# Patient Record
Sex: Female | Born: 1935 | Race: White | Hispanic: No | Marital: Married | Smoking: Never smoker
Health system: Southern US, Community
[De-identification: ages and names within clinical notes are randomized; demographics above are authoritative.]

## PROBLEM LIST (undated history)

## (undated) DIAGNOSIS — K219 Gastro-esophageal reflux disease without esophagitis: Secondary | ICD-10-CM

## (undated) DIAGNOSIS — M4800 Spinal stenosis, site unspecified: Secondary | ICD-10-CM

## (undated) DIAGNOSIS — R002 Palpitations: Secondary | ICD-10-CM

## (undated) DIAGNOSIS — N952 Postmenopausal atrophic vaginitis: Secondary | ICD-10-CM

## (undated) DIAGNOSIS — I10 Essential (primary) hypertension: Secondary | ICD-10-CM

## (undated) DIAGNOSIS — F419 Anxiety disorder, unspecified: Secondary | ICD-10-CM

## (undated) DIAGNOSIS — E785 Hyperlipidemia, unspecified: Secondary | ICD-10-CM

## (undated) DIAGNOSIS — I499 Cardiac arrhythmia, unspecified: Secondary | ICD-10-CM

## (undated) DIAGNOSIS — K118 Other diseases of salivary glands: Secondary | ICD-10-CM

## (undated) DIAGNOSIS — N898 Other specified noninflammatory disorders of vagina: Secondary | ICD-10-CM

## (undated) HISTORY — DX: Palpitations: R00.2

## (undated) HISTORY — DX: Spinal stenosis, site unspecified: M48.00

## (undated) HISTORY — DX: Other specified noninflammatory disorders of vagina: N89.8

## (undated) HISTORY — PX: CHOLECYSTECTOMY: SHX55

## (undated) HISTORY — DX: Anxiety disorder, unspecified: F41.9

## (undated) HISTORY — DX: Essential (primary) hypertension: I10

## (undated) HISTORY — DX: Gastro-esophageal reflux disease without esophagitis: K21.9

## (undated) HISTORY — DX: Hyperlipidemia, unspecified: E78.5

## (undated) HISTORY — PX: UPPER GASTROINTESTINAL ENDOSCOPY: SHX188

## (undated) HISTORY — DX: Postmenopausal atrophic vaginitis: N95.2

## (undated) HISTORY — PX: COLONOSCOPY: SHX174

---

## 1962-11-23 HISTORY — PX: OTHER SURGICAL HISTORY: SHX169

## 1970-11-23 HISTORY — PX: DILATION AND CURETTAGE OF UTERUS: SHX78

## 2000-12-28 ENCOUNTER — Encounter: Payer: Self-pay | Admitting: Neurosurgery

## 2000-12-28 ENCOUNTER — Encounter: Admission: RE | Admit: 2000-12-28 | Discharge: 2000-12-28 | Payer: Self-pay | Admitting: Neurosurgery

## 2001-03-07 ENCOUNTER — Encounter: Payer: Self-pay | Admitting: Internal Medicine

## 2001-03-07 ENCOUNTER — Ambulatory Visit (HOSPITAL_COMMUNITY): Admission: RE | Admit: 2001-03-07 | Discharge: 2001-03-07 | Payer: Self-pay | Admitting: Internal Medicine

## 2002-04-12 ENCOUNTER — Ambulatory Visit (HOSPITAL_COMMUNITY): Admission: RE | Admit: 2002-04-12 | Discharge: 2002-04-12 | Payer: Self-pay | Admitting: Internal Medicine

## 2002-04-12 ENCOUNTER — Encounter: Payer: Self-pay | Admitting: Internal Medicine

## 2002-09-06 ENCOUNTER — Encounter: Payer: Self-pay | Admitting: Internal Medicine

## 2002-09-06 ENCOUNTER — Ambulatory Visit (HOSPITAL_COMMUNITY): Admission: RE | Admit: 2002-09-06 | Discharge: 2002-09-06 | Payer: Self-pay | Admitting: Internal Medicine

## 2003-04-13 ENCOUNTER — Encounter: Payer: Self-pay | Admitting: Internal Medicine

## 2003-04-13 ENCOUNTER — Ambulatory Visit (HOSPITAL_COMMUNITY): Admission: RE | Admit: 2003-04-13 | Discharge: 2003-04-13 | Payer: Self-pay | Admitting: Internal Medicine

## 2004-04-28 ENCOUNTER — Ambulatory Visit (HOSPITAL_COMMUNITY): Admission: RE | Admit: 2004-04-28 | Discharge: 2004-04-28 | Payer: Self-pay | Admitting: Internal Medicine

## 2004-05-07 ENCOUNTER — Ambulatory Visit (HOSPITAL_COMMUNITY): Admission: RE | Admit: 2004-05-07 | Discharge: 2004-05-07 | Payer: Self-pay | Admitting: Internal Medicine

## 2004-11-03 ENCOUNTER — Ambulatory Visit: Payer: Self-pay | Admitting: Cardiology

## 2004-11-03 ENCOUNTER — Ambulatory Visit (HOSPITAL_COMMUNITY): Admission: RE | Admit: 2004-11-03 | Discharge: 2004-11-03 | Payer: Self-pay | Admitting: Internal Medicine

## 2005-04-27 ENCOUNTER — Ambulatory Visit (HOSPITAL_COMMUNITY): Admission: RE | Admit: 2005-04-27 | Discharge: 2005-04-28 | Payer: Self-pay | Admitting: Internal Medicine

## 2005-04-29 ENCOUNTER — Ambulatory Visit (HOSPITAL_COMMUNITY): Admission: RE | Admit: 2005-04-29 | Discharge: 2005-04-29 | Payer: Self-pay | Admitting: Internal Medicine

## 2005-04-29 ENCOUNTER — Ambulatory Visit: Payer: Self-pay | Admitting: Cardiology

## 2006-05-03 ENCOUNTER — Ambulatory Visit (HOSPITAL_COMMUNITY): Admission: RE | Admit: 2006-05-03 | Discharge: 2006-05-03 | Payer: Self-pay

## 2006-06-01 ENCOUNTER — Ambulatory Visit: Payer: Self-pay | Admitting: Internal Medicine

## 2006-06-03 ENCOUNTER — Ambulatory Visit: Payer: Self-pay | Admitting: Internal Medicine

## 2006-06-03 ENCOUNTER — Ambulatory Visit (HOSPITAL_COMMUNITY): Admission: RE | Admit: 2006-06-03 | Discharge: 2006-06-03 | Payer: Self-pay | Admitting: Internal Medicine

## 2006-06-04 ENCOUNTER — Ambulatory Visit (HOSPITAL_COMMUNITY): Admission: RE | Admit: 2006-06-04 | Discharge: 2006-06-04 | Payer: Self-pay | Admitting: Internal Medicine

## 2006-07-27 ENCOUNTER — Ambulatory Visit: Payer: Self-pay | Admitting: Internal Medicine

## 2006-08-02 ENCOUNTER — Encounter (HOSPITAL_COMMUNITY): Admission: RE | Admit: 2006-08-02 | Discharge: 2006-08-21 | Payer: Self-pay | Admitting: Internal Medicine

## 2006-10-05 ENCOUNTER — Ambulatory Visit (HOSPITAL_COMMUNITY): Admission: RE | Admit: 2006-10-05 | Discharge: 2006-10-05 | Payer: Self-pay | Admitting: Ophthalmology

## 2007-02-21 ENCOUNTER — Ambulatory Visit: Payer: Self-pay | Admitting: Internal Medicine

## 2007-03-10 ENCOUNTER — Encounter (HOSPITAL_COMMUNITY): Admission: RE | Admit: 2007-03-10 | Discharge: 2007-04-09 | Payer: Self-pay | Admitting: Internal Medicine

## 2007-04-13 ENCOUNTER — Ambulatory Visit: Payer: Self-pay | Admitting: Internal Medicine

## 2007-04-19 ENCOUNTER — Ambulatory Visit: Payer: Self-pay | Admitting: Internal Medicine

## 2007-05-06 ENCOUNTER — Ambulatory Visit (HOSPITAL_COMMUNITY): Admission: RE | Admit: 2007-05-06 | Discharge: 2007-05-06 | Payer: Self-pay | Admitting: Internal Medicine

## 2007-10-27 ENCOUNTER — Ambulatory Visit: Payer: Self-pay | Admitting: Internal Medicine

## 2007-11-07 ENCOUNTER — Ambulatory Visit (HOSPITAL_COMMUNITY): Admission: RE | Admit: 2007-11-07 | Discharge: 2007-11-07 | Payer: Self-pay | Admitting: Internal Medicine

## 2007-11-07 ENCOUNTER — Encounter: Payer: Self-pay | Admitting: Internal Medicine

## 2007-11-07 ENCOUNTER — Ambulatory Visit: Payer: Self-pay | Admitting: Internal Medicine

## 2007-12-27 ENCOUNTER — Ambulatory Visit: Payer: Self-pay | Admitting: Internal Medicine

## 2008-05-08 ENCOUNTER — Ambulatory Visit (HOSPITAL_COMMUNITY): Admission: RE | Admit: 2008-05-08 | Discharge: 2008-05-08 | Payer: Self-pay | Admitting: Internal Medicine

## 2008-06-22 DIAGNOSIS — Z8679 Personal history of other diseases of the circulatory system: Secondary | ICD-10-CM | POA: Insufficient documentation

## 2008-06-22 DIAGNOSIS — K649 Unspecified hemorrhoids: Secondary | ICD-10-CM | POA: Insufficient documentation

## 2008-06-22 DIAGNOSIS — K219 Gastro-esophageal reflux disease without esophagitis: Secondary | ICD-10-CM | POA: Insufficient documentation

## 2008-06-22 DIAGNOSIS — Z87448 Personal history of other diseases of urinary system: Secondary | ICD-10-CM | POA: Insufficient documentation

## 2008-06-22 DIAGNOSIS — R197 Diarrhea, unspecified: Secondary | ICD-10-CM | POA: Insufficient documentation

## 2008-06-22 DIAGNOSIS — I359 Nonrheumatic aortic valve disorder, unspecified: Secondary | ICD-10-CM | POA: Insufficient documentation

## 2008-11-23 HISTORY — PX: OTHER SURGICAL HISTORY: SHX169

## 2008-12-12 ENCOUNTER — Ambulatory Visit: Payer: Self-pay | Admitting: Internal Medicine

## 2008-12-12 LAB — CONVERTED CEMR LAB
IgA: 297 mg/dL (ref 68–378)
Tissue Transglutaminase Ab, IgA: 0.5 units (ref <7)

## 2008-12-13 ENCOUNTER — Encounter (HOSPITAL_COMMUNITY): Admission: RE | Admit: 2008-12-13 | Discharge: 2009-01-12 | Payer: Self-pay | Admitting: Internal Medicine

## 2009-01-11 ENCOUNTER — Ambulatory Visit (HOSPITAL_COMMUNITY): Admission: RE | Admit: 2009-01-11 | Discharge: 2009-01-11 | Payer: Self-pay | Admitting: General Surgery

## 2009-01-23 ENCOUNTER — Encounter: Payer: Self-pay | Admitting: Internal Medicine

## 2009-01-25 ENCOUNTER — Encounter: Admission: RE | Admit: 2009-01-25 | Discharge: 2009-01-25 | Payer: Self-pay | Admitting: General Surgery

## 2009-05-14 ENCOUNTER — Ambulatory Visit (HOSPITAL_COMMUNITY): Admission: RE | Admit: 2009-05-14 | Discharge: 2009-05-14 | Payer: Self-pay | Admitting: Internal Medicine

## 2009-07-23 ENCOUNTER — Ambulatory Visit: Payer: Self-pay | Admitting: Internal Medicine

## 2009-07-24 DIAGNOSIS — R1319 Other dysphagia: Secondary | ICD-10-CM | POA: Insufficient documentation

## 2009-07-25 ENCOUNTER — Telehealth (INDEPENDENT_AMBULATORY_CARE_PROVIDER_SITE_OTHER): Payer: Self-pay

## 2009-07-25 ENCOUNTER — Encounter: Payer: Self-pay | Admitting: Internal Medicine

## 2009-07-26 ENCOUNTER — Encounter (INDEPENDENT_AMBULATORY_CARE_PROVIDER_SITE_OTHER): Payer: Self-pay

## 2009-07-26 ENCOUNTER — Encounter: Payer: Self-pay | Admitting: Internal Medicine

## 2009-07-26 DIAGNOSIS — K828 Other specified diseases of gallbladder: Secondary | ICD-10-CM | POA: Insufficient documentation

## 2009-07-26 DIAGNOSIS — Z860100 Personal history of colon polyps, unspecified: Secondary | ICD-10-CM | POA: Insufficient documentation

## 2009-07-30 LAB — CONVERTED CEMR LAB
Alkaline Phosphatase: 58 units/L (ref 39–117)
Bilirubin, Direct: 0.1 mg/dL (ref 0.0–0.3)
Indirect Bilirubin: 0.3 mg/dL (ref 0.0–0.9)
Total Bilirubin: 0.4 mg/dL (ref 0.3–1.2)
Total Protein: 6.6 g/dL (ref 6.0–8.3)

## 2009-08-01 ENCOUNTER — Telehealth (INDEPENDENT_AMBULATORY_CARE_PROVIDER_SITE_OTHER): Payer: Self-pay

## 2009-08-02 ENCOUNTER — Ambulatory Visit: Payer: Self-pay | Admitting: Internal Medicine

## 2009-08-02 ENCOUNTER — Ambulatory Visit (HOSPITAL_COMMUNITY): Admission: RE | Admit: 2009-08-02 | Discharge: 2009-08-02 | Payer: Self-pay | Admitting: Internal Medicine

## 2010-05-16 ENCOUNTER — Ambulatory Visit (HOSPITAL_COMMUNITY)
Admission: RE | Admit: 2010-05-16 | Discharge: 2010-05-16 | Payer: Self-pay | Source: Home / Self Care | Admitting: Internal Medicine

## 2010-10-14 ENCOUNTER — Ambulatory Visit: Payer: Self-pay | Admitting: Internal Medicine

## 2010-10-21 DIAGNOSIS — K3189 Other diseases of stomach and duodenum: Secondary | ICD-10-CM | POA: Insufficient documentation

## 2010-10-21 DIAGNOSIS — R1013 Epigastric pain: Secondary | ICD-10-CM

## 2010-11-04 ENCOUNTER — Ambulatory Visit (HOSPITAL_COMMUNITY)
Admission: RE | Admit: 2010-11-04 | Discharge: 2010-11-04 | Payer: Self-pay | Source: Home / Self Care | Attending: Internal Medicine | Admitting: Internal Medicine

## 2010-12-23 NOTE — Assessment & Plan Note (Signed)
Summary: 63YR F/U W/RMR,GERD/LAW   Visit Type:  Follow-up Visit Primary Care Provider:  fagan  Chief Complaint:  1 year follow up- still having problems with reflux and time for her tcs.  History of Present Illness: 77 year old lady here for followup. She has had  progressive postprandia, bandlike upper abdominal discomfort; she describes as "burning".  Patient reports its really" getting her downl". She's had symptoms now for some years; we noted a diminution in gallbladder ejection fraction on serial HIDA's x2 previously; ultrasound gallbladder previously demonstrated no abnormalities. She saw Dr. Claud Kelp. He did not feel that she would benefit from cholecystectomy at that time. She had some small erosions on EGD previously prior; history of adenomatous tissue removed from her colon in 2008. Recommended  she have another colonoscopy now. She's not have any lower GI tract symptoms. She is has been multiple acid suppressing agents  and more recently Carafate prescribed Dr. Ouida Sills without any improvement in her GI tract symptoms. She does not have any dysphagia or odynophagia. She has not lost any weight; she denies fever or  chills.  Current Problems (verified): 1)  Colonic Polyps, Hx of  (ICD-V12.72) 2)  Biliary Dyskinesia  (ICD-575.8) 3)  Dysphagia  (ICD-787.29) 4)  Diarrhea  (ICD-787.91) 5)  Hemorrhoids  (ICD-455.6) 6)  Gerd  (ICD-530.81) 7)  Hypertension, Hx of  (ICD-V12.50) 8)  Pvcs  () 9)  Aortic Stenosis  (ICD-424.1) 10)  Hematuria, Hx of  (ICD-V13.09) 11)  Spinal Stenosis  () 12)  Hyperlipidemia  (ICD-V45.77)  Current Medications (verified): 1)  Lipitor 5mg  .... Once Daily 2)  Maxzide 25mg  .... Once Daily 3)  Toprol Xl 25mg  .... Once Daily 4)  Ativan 2mg  .... At Bedtime 5)  Tums .... As Needed 6)  Zantac .... As Needed 7)  Asa 81 Mg .... Take 1 Tablet By Mouth Once A Day 8)  Carafate 1 Gm Tabs (Sucralfate) .... Q Ac and Hs As Needed 9)  Zegerid Otc 20-1100 Mg Caps  (Omeprazole-Sodium Bicarbonate) .... 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  Alcohol Use - yes Patient gets regular exercise.  Risk Factors: Exercise: yes (07/28/2009)  Risk Factors: Smoking Status: never (July 28, 2009)  Vital Signs:  Patient profile:   77 year old female Height:      60 inches Weight:      141 pounds BMI:     27.64 Temp:     97.7 degrees F oral Pulse rate:   68 / minute BP sitting:   122 / 70  (left arm) Cuff size:   regular  Vitals Entered By: Hendricks Limes LPN (October 14, 2010 2:28 PM)  Physical Exam  General:  pleasant alert lady resting comfortably Eyes:  no scleral icterus Lungs:  clear to auscultation Heart:  regular rate rhythm without murmur gallop or Abdomen:  abdomen nondistended positive bowel sounds soft, nontender without appreciable mass or organomegaly Rectal:  deferred until the time of colonoscopy  Impression & Recommendations:  Impression: 77 year old lady with chronic insidiously worsening postprandial bandlike upper abdominal pain she describes as burning. She has serial diminution in gallbladder ejection fraction on HIDA x2 ;normal gallbladder on ultrasound previously. No significant findings in her stomach previously. Acid suppression and  Carafate are not helping. I continue to believe this lady has symptomatic  biliary dyskinesia to account for her symptoms. Ultimately, she'll she's need to see the surgeon to get her gallbladder out. However, she does need a followup colonoscopy given findings back in 2008. I told Ms. Mansouri we'll get a look at her stomach at the same time just to make sure she hasn't developed any new occult pathology. Depending on findings of this evaluation, we'll likely get her back to see Dr. Derrell Lolling in a to reassess and reconsider cholecystectomy.   I discussed approach of EGd and  colonoscopy with Ms. Bergerson; questions been answered.  We talked about the risks, benefits limitations and imponderables and alternatives . She is agreeable. Further recommendations in  the very near future.  Appended Document: Orders Update    Clinical Lists Changes  Problems: Added new problem of DYSPEPSIA (ICD-536.8) Added new problem of SPECIAL SCREENING FOR MALIGNANT NEOPLASMS COLON (ICD-V76.51) Orders: Added new Service order of Est. Patient Level IV (16109) - Signed

## 2010-12-23 NOTE — Letter (Signed)
Summary: EGD /TCS ORDER  EGD /TCS ORDER   Imported By: Ave Filter 10/14/2010 16:02:33  _____________________________________________________________________  External Attachment:    Type:   Image     Comment:   External Document

## 2011-03-30 ENCOUNTER — Encounter (INDEPENDENT_AMBULATORY_CARE_PROVIDER_SITE_OTHER): Payer: Self-pay | Admitting: General Surgery

## 2011-04-02 ENCOUNTER — Other Ambulatory Visit: Payer: Self-pay | Admitting: General Surgery

## 2011-04-02 ENCOUNTER — Ambulatory Visit
Admission: RE | Admit: 2011-04-02 | Discharge: 2011-04-02 | Disposition: A | Discharge status: Home / Self Care | Payer: MEDICARE | Source: Ambulatory Visit | Attending: General Surgery | Admitting: General Surgery

## 2011-04-02 DIAGNOSIS — Z01811 Encounter for preprocedural respiratory examination: Secondary | ICD-10-CM

## 2011-04-07 NOTE — Assessment & Plan Note (Signed)
[name], [name]               CHART#:  [id number]   DATE:  12/12/2008                       DOB:  November 24, 1930   PROBLEM LIST:  1. History of postprandial epigastric pain.  2. History of gastroesophageal reflux disease/erosive reflux      esophagitis.  3. Intermittent diarrhea.   Last seen on December 27, 2007.  Over the past year, the patient has done  well.  She has had problems with intermittent vague postprandial  epigastric discomfort.  Prior ultrasound demonstrated unremarkable  gallbladder prior to  hiatus previously demonstrated, gallbladder EF is  in the 32% range without any reproduction in her symptoms.  These  symptoms have not worsened, but they have been persistent.  Her typical  reflux symptoms she has had along the way have been well managed with  the various proton pump inhibitors, Nexium, Aciphex, most recently  Prevacid.  She has also been on Prilosec as well.  She was having  worsening difficulty with epigastric pain back in the fall, saw Dr.  Ouida Sills and she bumped her Aciphex up to b.i.d.  This produced headache  and worsening of diarrhea, headache improved.  She has only one bowel  movement daily.  Colonoscopy back on November 07, 2007, demonstrated  normal ileum, some left-sided transverse diverticula, some anal canal  hemorrhoids.  Segmental biopsies regular rule out microscopic colitis.  There was some adenomatoid mucosa, but there was microscopic colitis.  Originally it was called as an adenoma polyp in one of the areas of  random mucosal biopsies.  After discussion with Dr. Colonel Bald, pathologist,  it was felt this was probably a variant of normal.  However, it was  recommended in this case that she come back in 2011 for a repeat  colonoscopy.  Stool studies at that time also negative for  inflammation/infection.   CURRENT MEDICATIONS:  See updated list.   ALLERGIES:  No known drug allergies.   PHYSICAL EXAMINATION:  GENERAL:  Today well-groomed,  down-to-earth lady  resting comfortably.  VITAL SIGNS:  Her weight is down 2 pounds improved from previous weight,  height 5 feet, temperature 98, BP 130/80, and pulse 60.  SKIN:  Warm and dry.  CHEST:  Lungs are clear to auscultation.  CARDIAC:  Regular rate and rhythm without murmur, gallop, or rub.  ABDOMEN:  Nondistended, positive bowel sounds, soft, nontender without  appreciable mass or organomegaly.   ASSESSMENT:  1. History of gastroesophageal reflux disease/erosive reflux      esophagitis.  Symptoms are well controlled on proton pump inhibitor      therapy.  She has had side effects with Nexium, more recently      Aciphex tolerating, Prevacid 30 mg once daily very well.  I have      suggested she continue that regimen.  I have given her      prescription.  2. Epigastric pain often postprandial.  I would have to be concerned      that her gallbladder may have been the culprit along.  These      symptoms are not at all going away, they are intermittent in      nature.  She has had some vague off and on diarrhea and abdominal      bloating along the way and she has not been screened for celiac  disease, although this would be a reach, this patient needs to be      covered.  Again, I feel that her epigastric pain maybe secondary to      her gallbladder disease more than anything else.   RECOMMENDATIONS:  1. We will get a serum IgA and a transglutaminase IgA level.  2. We will go ahead and get a HIDA scan to see if we can document a      diminution in gallbladder ejection fraction.  This turns out to be      the case regardless of any postprocedure symptoms.  I told the      patient we would probably move towards getting her to see a surgeon      regarding cholecystectomy, but we will make further recommendations      in the very near future.       Jonathon Bellows, M.D.  Electronically Signed     RMR/MEDQ  D:  12/12/2008  T:  12/12/2008  Job:  [id number]   cc:   Kingsley Callander. Ouida Sills, MD

## 2011-04-07 NOTE — Assessment & Plan Note (Signed)
[name], [name]               CHART#:  [id number]   DATE:                                   DOB:  [birth year]   CHIEF COMPLAINT:  Follow up diarrhea.   HISTORY:  [name] was last seen by me on November 07, 2007, at  which time she underwent a colonoscopy, to further evaluate diarrhea.  She underwent a delayed colonoscopy with segmental biopsies, stool  sampling.  She does have some anal hemorrhoids and left-sided transverse  diverticula.  The remainder of the colonic mucosa, rectal mucosa and  terminal ileum mucosa appeared normal.  Segmental biopsies failed to  demonstrate microscopic colitis.  All of her stool studies from the  procedure came back negative; however, there was one Dr. Dierdre Searles did call  from her ascending colon, a tubular adenoma.  I went back and reviewed  with Dr. Nile Riggs.  He reviewed the pathology and felt like the  adenoma was actually adenomatoid epithelial mucosa not likely  clinically significant.   We have not found a culprit with her diarrhea, but the good news is that  today [name] reports her bowel function has normalized.  Her  stools have firmed up and frequency has diminished.  She is very happy.  Her reflux symptoms are well-controlled on Aciphex 20 grams once daily.  She states she is not quite back to normal as far as bowel function is  concerned.  She is almost there.  She has gained 1-1/2 pounds since her  last visit.   CURRENT MEDICATIONS:  See the updated list.   ALLERGIES:  No known drug allergies.   PHYSICAL EXAMINATION:  GENERAL:  She looks well.  VITAL SIGNS:  Weight 138-1/2 pounds, height 5 feet, temperature 97.7  degrees, blood pressure 118/72, pulse 72.  SKIN:  Warm and dry.  ABDOMEN:  Flat, with positive bowel sounds.  Entirely soft, nontender,  without appreciable mass or organomegaly.   ASSESSMENT:  Recent protracted diarrheal illness, seemingly now much  better.  We have not found the etiological edge.  It may  have been a  protracted viral syndrome with a little irritable bowel syndrome inter-  mixed.  Any right findings of evaluation are reassuring thus far.   RECOMMENDATIONS:  1. Will give her some samples of Align probiotic.  She can take one      capsule daily for one month or so, and see how she likes it.  2. Gastroesophageal reflux disease symptoms well-controlled on      Aciphex.  No further intervention warranted at Nicole time.   FOLLOWUP:  I will plan to see her back in one year and see how things  are going next year.  Given the interesting findings on the pathology  specimens which in no way do I feel is cause for alarm.  I will  plan to  bring [name] back for a repeat screening colonoscopy in three  years.       Jonathon Bellows, M.D.  Electronically Signed     RMR/MEDQ  D:  12/27/2007  T:  12/27/2007  Job:  [id number]   cc:   Kingsley Callander. Ouida Sills, MD

## 2011-04-07 NOTE — Op Note (Signed)
[name], [name]              ACCOUNT NO.:  [id number]   MEDICAL RECORD NO.:  [id number]          PATIENT TYPE:  AMB   LOCATION:  DAY                           FACILITY:  [facility]   PHYSICIAN:  R. Roetta Sessions, M.D. DATE OF BIRTH:  1931-10-23   DATE OF PROCEDURE:  11/07/2007  DATE OF DISCHARGE:                               OPERATIVE REPORT   PROCEDURE:  Ileal colonoscopy with single biopsy, stool sampling.   INDICATIONS FOR PROCEDURE:  A 77 year old lady with eight month history  of incessant diarrhea.  Colonoscopy is now being done to further  evaluate this symptom.  This approach has been discussed with the  patient at length.  Potential risks, benefits and alternatives have been  reviewed.  There is no history of inflammatory bowel disease or  colorectal cancer in the patient's family.  The last colonoscopy was  done years ago.   PROCEDURE NOTE:  O2 saturation, blood pressure, pulses, and respirations  were monitored throughout the entire procedure.  Conscious sedation with  Versed 4 mg IV and Demerol 75 mg IV in divided doses.   INSTRUMENT:  Pentax video chip system.   FINDINGS:  Digital rectal exam revealed minimal anal canal hemorrhoids.   ENDOSCOPIC FINDINGS:  Prep was adequate.   COLON:  The colonic mucosa was surveyed from the rectosigmoid junction  through the left transverse, right colon, the appendiceal orifice, the  ileocecal valve, and cecum.  These structures were well seen and  photographed for the record.  The terminal ileum had been intubated to 5  cm.  From this level, the scope was slowly withdrawn, and all previously  mentioned mucosal surfaces were again seen.  The patient had:  1)  Left-  sided transverse diverticula.  2)  Otherwise normal-appearing colonic  mucosa.  The terminal ileum mucosa also appeared normal.  Segmental  biopsies of the ascending, descending, and sigmoid segments were taken  for histologic study.  Also, stool samples sent to the  microbiology lab.  The scope was pulled down into the rectum, where a thorough examination  of the rectal mucosa was undertaken.  The rectal vault was small, and I  was unable to retroflex, although I tried, but for the same reason I was  able to see the rectal mucosa very well en face, and aside from minimal  anal canal hemorrhoids, the rectal mucosa appeared entirely normal.  The  rectal mucosa was also biopsied.  The patient tolerated the procedure  well and was reactive to endoscopy.   ENDOSCOPY IMPRESSION:  1. Anal canal hemorrhoids, otherwise normal rectum.  2. Left-sided transverse diverticula in the remaining colonic mucosa.  3. The terminal ileal mucosa appeared normal.  4. Status post segmental biopsy and stool sampling.   RECOMMENDATIONS:  1. Diverticulosis literature provided to Ms. Lelon Perla.  2. Follow up on path and stool studies.  3. Further recommendations to follow.   ADDENDUM:  As far as family history goes, I do know there is one cousin  who reportedly has Crohn's disease.      Jonathon Bellows, M.D.  Electronically Signed  RMR/MEDQ  D:  11/07/2007  T:  11/07/2007  Job:  [id number]   cc:   Kingsley Callander. Ouida Sills, MD  Fax: [phone]

## 2011-04-07 NOTE — Assessment & Plan Note (Signed)
[name], [name]               CHART#:  [id number]   DATE:  04/13/2007                       DOB:  January 23, 1931   For followup GERD, upper abdominal burning, biliary dyskinesia.  The  patient is back after office followup, 02/21/2007.  She had been on  Aciphex 20 mg orally b.i.d. with very good control of the above-  mentioned symptoms.  Ultrasound of the right upper quadrant was  negative.  Cholecystitis __________ sector.  Back in September, 2007,  gallbladder EF was 32%.  We repeated the HIDA scan recently which  demonstrated a gallbladder EF, again, of 32% without reproductive  symptoms.  She ran out of Aciphex and has gone with Prilosec 20 mg  orally b.i.d. and continues to be essentially symptom free.  She did go  to [facility] and developed acute diarrheal illness lasting 3 days  since her last office visit.  She tells me Dr. Lovell Sheehan performed a  colonoscopy in 2000, this was negative.  She is due for routine  screening in 2010.  Overall, she feels well right now and has no GI  symptoms.   CURRENT MEDICATIONS:  See updated list.   ALLERGIES:  No known drug allergies.   PHYSICAL EXAMINATION:  She looks well today.  Weight 142, height 5 feet, temp 97.6, blood pressure 124/70, pulse 66.  SKIN:  Warm and dry.  ABDOMEN:  Flat, obese, positive bowel sounds, soft nontender, without  appreciable mass or organomegaly.   ASSESSMENT:  Gastroesophageal reflux disease symptoms, now well  controlled on b.i.d. omeprazole.  She has biliary dyskinesia but  clinically quiescent.  I do not think she needs to get her gallbladder  out at this time, but certainly that could change sometime in the  future.   RECOMMENDATIONS:  Continue omeprazole/Prilosec 20 mg orally b.i.d. for  the next 3 months and drop back to once daily thereafter.  I plan to see  this nice lady back in 6 months.  We will take the liberty to go ahead  and have her return three mail-in hemoccult cards now.       Jonathon Bellows, M.D.  Electronically Signed     RMR/MEDQ  D:  04/13/2007  T:  04/13/2007  Job:  [id number]   cc:   Kingsley Callander. Ouida Sills, MD

## 2011-04-07 NOTE — H&P (Signed)
[name], [name]              ACCOUNT NO.:  [id number]   MEDICAL RECORD NO.:  [id number]          PATIENT TYPE:  AMB   LOCATION:  DAY                           FACILITY:  [facility]   PHYSICIAN:  R. Roetta Sessions, M.D. DATE OF BIRTH:  1931-04-06   DATE OF ADMISSION:  10/27/2007  DATE OF DISCHARGE:                              HISTORY & PHYSICAL   CHIEF COMPLAINT:  Diarrhea.   [name] was last seen Apr 13, 2007, for intermittent postprandial  epigastric pain, reflux symptoms.  She has had multiple HIDAs,  demonstrating a somewhat depressed gallbladder EF, but those symptoms  have settled and she is not known to have gallstones.  Her reflux  symptoms are now well-controlled on Aciphex 20 mg orally daily.  Her big  concern now is a good 6- to 35-month history of nonbloody watery  diarrhea.  She tells me that she is pretty well confined to home the  first half of the day because of multiple loose bowel movements.  She  has never passed any blood per rectum.  She rarely, rarely ever has a  formed bowel movement.  Most of her stools are in the morning, really  does not have a bowel movement later in the day.  This pattern has been  ongoing for 6 to 8 months although she has had more intermittent  diarrhea in the setting of normal bowel function over the past several  years.  She has lost 5 pounds since May 2008.  Her last colonoscopy was  in 2000, performed by Dr. _________.  Reportedly had  diverticulosis at  that time.  There is no family history of inflammatory bowel disease or  colorectal neoplasia.  She does take an Imodium occasionally for  diarrhea.   PAST MEDICAL HISTORY:  Significant for hyperlipidemia, spinal stenosis,  history of hematuria, aortic stenosis, PVCs, hypertension.   PAST SURGICAL HISTORY:  D&C, bilateral breast surgery for benign cysts.  EGD performed by me on June 03, 2006, demonstrated erosive reflux  esophagitis.   CURRENT MEDICATIONS:  1. Lipitor 5 mg  daily.  2. Maxzide 25 mg daily.  3. Toprol 25 mg daily.  4. Ativan 2 mg at bedtime.  5. Aciphex 20 mg once daily.   ALLERGIES:  No known drug allergies.   FAMILY HISTORY:  Negative for chronic GI or liver illness.  Mother is  still alive at 75 with dementia.  Father deceased at age 31 with heart  disease.  No history of chronic GI or liver illness although she does  remind me she had a first cousin with Crohn's disease.   SOCIAL HISTORY:  The patient is married, has one child.  She is a  retired Metallurgist.  No tobacco.  Rarely consumes alcohol  socially.   REVIEW OF SYSTEMS:  No chest pain, dyspnea on exertion.  No fevers or  chills.  Some weight loss as noted above.  She has not had any  odynophagia.  No dysphagia, early satiety, nausea or vomiting.  No  melena or hematochezia.   PHYSICAL EXAMINATION:  GENERAL:  Physical examination  today reveals a  pleasant 77 year old lady resting comfortably.  VITAL SIGNS:  Weight 137, height 5 foot, temperature 97, blood pressure  120/80, pulse 64.  SKIN:  Warm and dry.  There is no jaundice.  HEENT:  No scleral icterus.  Conjunctivae are pink.  CARDIAC:  Regular rate and rhythm without murmur, gallop, rub.  BREAST:  Exam deferred.  ABDOMEN:  Nondistended.  Positive bowel sounds.  Soft, nontender.  Without appreciable mass or organomegaly.  EXTREMITIES:  No edema.  RECTAL:  Exam deferred until the time of colonoscopy.   IMPRESSION:  [name] is a pleasant 77 year old lady with a good 8-  month history of basically incessant diarrhea which limits her ability  to function normally the first half of the day.  She really has a  history of intermittent diarrhea going back at least a few years  previous to this past 6 to 8 months.  Symptoms would be a little  atypical for a irritable bowel syndrome.  I would be concerned about the  possibility of microscopic colitis among other diagnostic possibilities.  It is now pushing 9  years since she last had her colon imaged.  I told  [name] we ought to go ahead and do an ileocolonoscopy and plan to  at a minimum perform some biopsies of her colonic mucosa to rule out  microscopic colitis and do one set of stool studies.  the potential  risks, benefits and alternatives have been reviewed with her.  Her  questions were answered and she is agreeable.  Recommendation is to  perform diagnostic colonoscopy in the very near future at [facility] and make further recommendations.  At this time, she has a  history of aortic stenosis; however, given the new guidelines, she will  not need SBE prophylactic antibiotics.      Jonathon Bellows, M.D.  Electronically Signed     RMR/MEDQ  D:  10/27/2007  T:  10/27/2007  Job:  [id number]   cc:   Kingsley Callander. Ouida Sills, MD  Fax: [phone]

## 2011-04-10 NOTE — Consult Note (Signed)
Nicole [name]              ACCOUNT NO.:  [id number]   MEDICAL RECORD NO.:  [id number]          PATIENT TYPE:  AMB   LOCATION:                                FACILITY:  [facility]   PHYSICIAN:  R. Roetta Sessions, M.D. DATE OF BIRTH:  November 21, 1931   DATE OF CONSULTATION:  06/01/2006  DATE OF DISCHARGE:                                   CONSULTATION   REASON FOR CONSULTATION:  Bloating, epigastric gnawing pain.   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 Carylon Perches for  further evaluation of above-mentioned symptoms.  Nicole [name] tells me for  the last year or so she has had intermittent retroxiphoid burning and  gnawing pain that sometimes radiates out to both upper quadrants.  Nicole  occurs after eating certain spicy, greasy foods.  Symptoms have been  ameliorated significantly with Nexium.  She has been on other acid-  suppression agents in the past including Protonix but Nexium seems to work  the best.  She has also had intermittent diarrhea which she thinks is also  improved with Nexium.  She may have 5 days of nonbloody diarrhea out of a  month and the rest of the time she has one to two formed bowel movements  daily.  She has not had any rectal bleeding or melena.  She had some severe  lower abdominal pain, approximately three episodes, back in 2003  attributable to diverticulitis.  She was not hospitalized.  Dr. Lovell Sheehan  reportedly performed a colonoscopy on Nicole [name] back in 2000 and  demonstrated only diverticulosis.   She tells me that milk certainly relieves her upper abdominal gnawing  discomfort.  Eating a meal generally also lessens the discomfort, but  depending on the content of the meal it may actually worsen the burning  sensation.   She does not have any frank regurgitation, no odynophagia or dysphagia.  She  does not use tobacco products but she has used a variety of  over-the-counter  nonsteroidals recently including Catering manager.  She has never had her upper  GI tract evaluated.  Back in 2003 she underwent an ultrasound of the abdomen  which demonstrated some thickening of the gallbladder wall but nothing else.  She saw Dr. Lovell Sheehan.  It was not felt that cholecystectomy was indicated at  that time.  She rarely consumes alcohol.  She does not use tobacco products.   PAST MEDICAL HISTORY:  Significant for hyperlipidemia, spinal stenosis,  history of hematuria, aortic stenosis, history of PVCs, hypertension.   PAST SURGERIES:  Include D&C, bilateral breast surgery for benign cysts.   CURRENT MEDICATIONS:  1.  Lipitor 5 mg daily.  2.  Nexium 40 mg daily.  3.  Maxzide 25 mg daily.  4.  Toprol-XL 25 mg daily.  5.  Ativan 2 mg at bedtime.  6.  Tums p.r.n.  7.  Mylanta p.r.n.  8.  Alka Seltzer p.r.n.   ALLERGIES:  No known drug allergies.   FAMILY HISTORY:  Mother is alive at age 45  with some dementia.  Father died  at age 52 with some heart disease.  No history of chronic GI or liver  illness although she reports her first cousin has Crohn's disease.   SOCIAL HISTORY:  The patient is married.  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  She has one child in good health.  No tobacco, occasional  alcohol.   REVIEW OF SYSTEMS:  No chest pain, dyspnea on exertion.  No change in  weight.  No fever or chills.   PHYSICAL EXAMINATION:  GENERAL:  Pleasant 77 year old [name] who looks  somewhat younger than her stated chronological age.  VITAL SIGNS:  Weight 141, height 5 feet.  Temperature 98.6, BP 160/82, pulse  70.  SKIN:  Warm and dry.  No jaundice, no cutaneous stigmata of chronic liver  disease.  HEENT:  No scleral icterus.  JVD is not prominent.  No oral cavity lesions.  CHEST:  Lungs are clear to auscultation.  CARDIAC:  Regular rate and rhythm without murmur, gallop, rub.  ABDOMEN:  Nondistended, positive bowel sounds.  She does have  some  epigastric tenderness to palpation without appreciable mass or organomegaly.  EXTREMITIES:  No edema.   IMPRESSION:  Nicole [name] is a pleasant 77 year old [name] with  epigastric burning, gnawing discomfort in the setting of intermittent  nonsteroidal antiinflammatory use, symptoms ameliorated with acid-  suppression therapy.  Some of her discomfort is certainly lessened with the  ingestion of food and milk.  More greasy, fatty meals tend to exacerbate the  symptoms.   She has diarrhea a small minority of the time which is suspect is nothing  more than irritable bowel syndrome.  She does perceive that as being a  problem at Nicole time.   Her symptoms really fall into the nebulous realm of dyspepsia.  Nicole could  be largely a reflux-related phenomenon but we need to rule out other luminal  pathology in her upper GI tract including peptic ulcer disease, neoplasia,  etc. (the latter being less likely).   RECOMMENDATIONS:  I have offered Nicole [name] a diagnostic EGD in the very  near future.  Potential risks, benefits, alternatives have been reviewed,  questions answered, and she is agreeable.  Plan to perform an EGD in the  very near future.   I would like to thank Dr. Carylon Perches for allowing me to see Nicole [name]  today.  Further recommendations to follow.      Jonathon Bellows, M.D.  Electronically Signed     RMR/MEDQ  D:  06/01/2006  T:  06/01/2006  Job:  [id number]   cc:   Kingsley Callander. Ouida Sills, MD  Fax: [phone]

## 2011-04-10 NOTE — Op Note (Signed)
NAMEBRANTLEIGH, MIFFLIN              ACCOUNT NO.:  [id number]   MEDICAL RECORD NO.:  [id number]          PATIENT TYPE:  AMB   LOCATION:  DAY                           FACILITY:  [facility]   PHYSICIAN:  R. Roetta Sessions, M.D. DATE OF BIRTH:  1931-06-28   DATE OF PROCEDURE:  06/03/2006  DATE OF DISCHARGE:                                 OPERATIVE REPORT   DIAGNOSTIC ESOPHAGOGASTRODUODENOSCOPY.   INDICATIONS FOR PROCEDURE:  The patient is a 77 year old lady with  epigastric burning, some gnawing discomfort, intermittent nonsteroidal anti-  inflammatory drug use, partial improvement with acid suppression therapy.  EGD is now being done.  This approach has been discussed with the patient at  length. Potential risks, benefits, and alternatives have been reviewed and  questions answered. She is agreeable. Please see documentation in the  medical record.   PROCEDURE:  O2 saturation, blood pressure, pulse, and respirations were  monitored throughout the entire procedure. Conscious sedation with Versed 6  mg IV and Demerol 125 mg IV in divided doses.   INSTRUMENT:  Olympus video chip system.   FINDINGS:  Examination of tubular esophagus revealed a entirely normal  appearing esophagus except at the EG junction, there was a single inverted  V-shaped 3-mm erosion at the EG junction.  The EG junction was easily  traversed.   Stomach:  Gastric cavity was empty and insufflated well with air. Thorough  examination of gastric mucosa including retroflexed view of the proximal  stomach and esophagogastric junction demonstrated entirely normal gastric  mucosa. Pylorus patent and easily traversed. Examination of bulb and second  portion revealed no abnormalities.  A distance from the incisors to the EG  junction measured 35 cm.  The patient tolerated the procedure well and was  reactive to endoscopy.   IMPRESSION:  Single inverted V-shaped erosion at the esophagogastric  junction consistent with focal  area of reflux otherwise normal esophagus,  stomach, D-1, D-2.   Mrs. Lawlor symptoms are somewhat atypical for any one entity.  If this is  pretty much all reflux, I am surprised she has not had a better response to  acid suppression therapy with Nexium.   RECOMMENDATIONS:  Gallbladder needs to be revisited. Will proceed with  gallbladder ultrasound.  Will stop Nexium for now and put her on Aciphex 20  mg orally b.i.d. for the next 2 weeks.  Will go from there.  Will review  ultrasound when available.      Jonathon Bellows, M.D.  Electronically Signed     RMR/MEDQ  D:  06/03/2006  T:  06/03/2006  Job:  [id number]   cc:   Kingsley Callander. Ouida Sills, MD  Fax: [phone]

## 2011-04-10 NOTE — Procedures (Signed)
NAMEBRYLYN, [name]              ACCOUNT NO.:  [id number]   MEDICAL RECORD NO.:  [id number]          PATIENT TYPE:  OUT   LOCATION:  RAD                           FACILITY:  [facility]   PHYSICIAN:  Teviston Bing, M.D.  DATE OF BIRTH:  January 18, 1931   DATE OF PROCEDURE:  11/03/2004  DATE OF DISCHARGE:                                  ECHOCARDIOGRAM   CLINICAL DATA:  A 77 year old woman with a murmur, palpitations, and  hypertension.   M-MODE:  Aorta 2.8, left atrium 3.4, septum 1.2, posterior wall 1.0, LV  diastole 3.4, and LV systole, 2.1.   1.  Technically suboptimal, but adequate echocardiographic study.  2.  Normal left atrium, right atrium, and right ventricle.  3.  Normal mitral valve; mild annular calcification.  4.  Very mild aortic valvular sclerosis; normal aortic root diameter; mild      aortic annular calcification.  5.  Normal tricuspid valve.  6.  Pulmonic valve not well seen--probably normal. Proximal pulmonary artery      is normal.  7.  Normal left ventricular size; borderline hypertrophy with normal      regional and global systolic function.  8.  Normal IVC.     Robe   RR/MEDQ  D:  11/03/2004  T:  11/04/2004  Job:  [id number]

## 2011-05-19 ENCOUNTER — Other Ambulatory Visit (HOSPITAL_COMMUNITY): Payer: Self-pay | Admitting: Internal Medicine

## 2011-05-19 DIAGNOSIS — Z139 Encounter for screening, unspecified: Secondary | ICD-10-CM

## 2011-05-25 ENCOUNTER — Ambulatory Visit (HOSPITAL_COMMUNITY)
Admission: RE | Admit: 2011-05-25 | Discharge: 2011-05-25 | Disposition: A | Discharge status: Home / Self Care | Payer: Medicare Other | Source: Ambulatory Visit | Attending: Internal Medicine | Admitting: Internal Medicine

## 2011-05-25 DIAGNOSIS — Z139 Encounter for screening, unspecified: Secondary | ICD-10-CM

## 2011-05-25 DIAGNOSIS — Z1231 Encounter for screening mammogram for malignant neoplasm of breast: Secondary | ICD-10-CM | POA: Insufficient documentation

## 2011-07-10 IMAGING — CR DG CHEST 2V
2 series · 2 of 2 positions shown · non-contrast
Comparison: None

CLINICAL DATA: Preop for cholecystectomy, hypertension

CHEST - 2 VIEW

[view not recorded (1 of 2)]
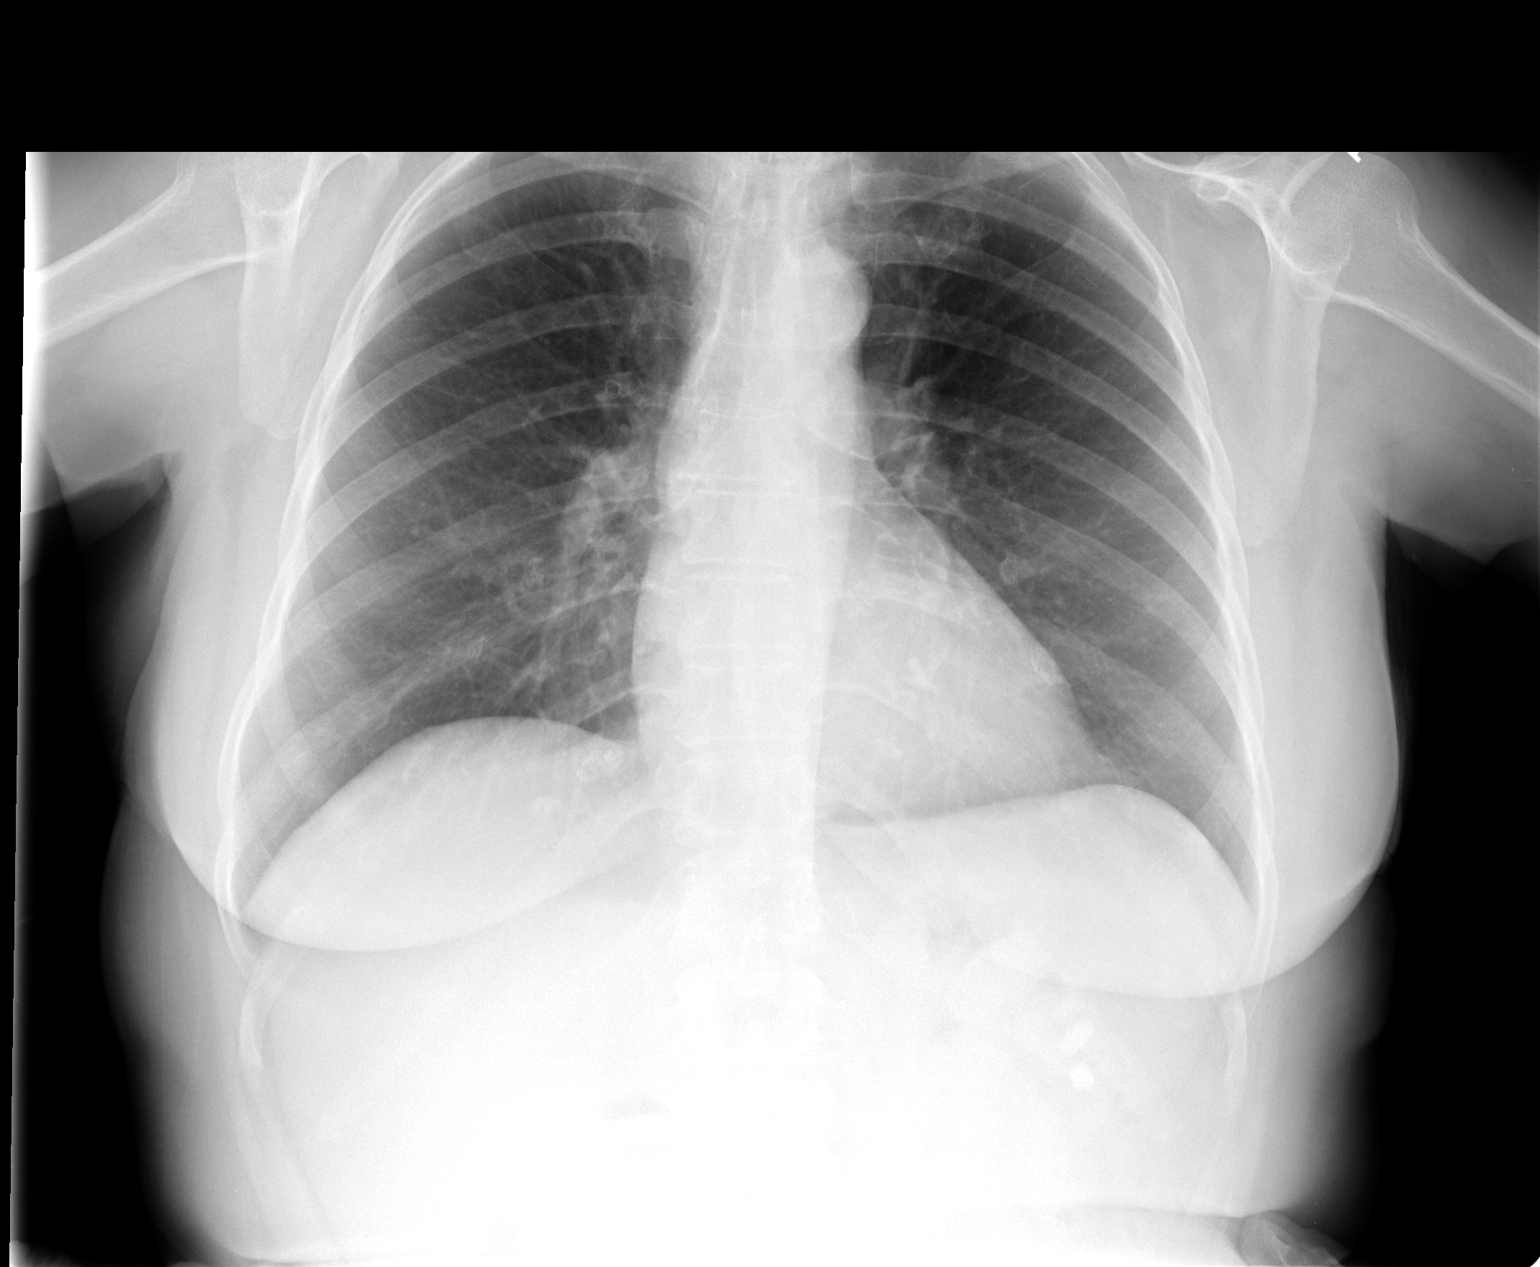

[view not recorded (2 of 2)]
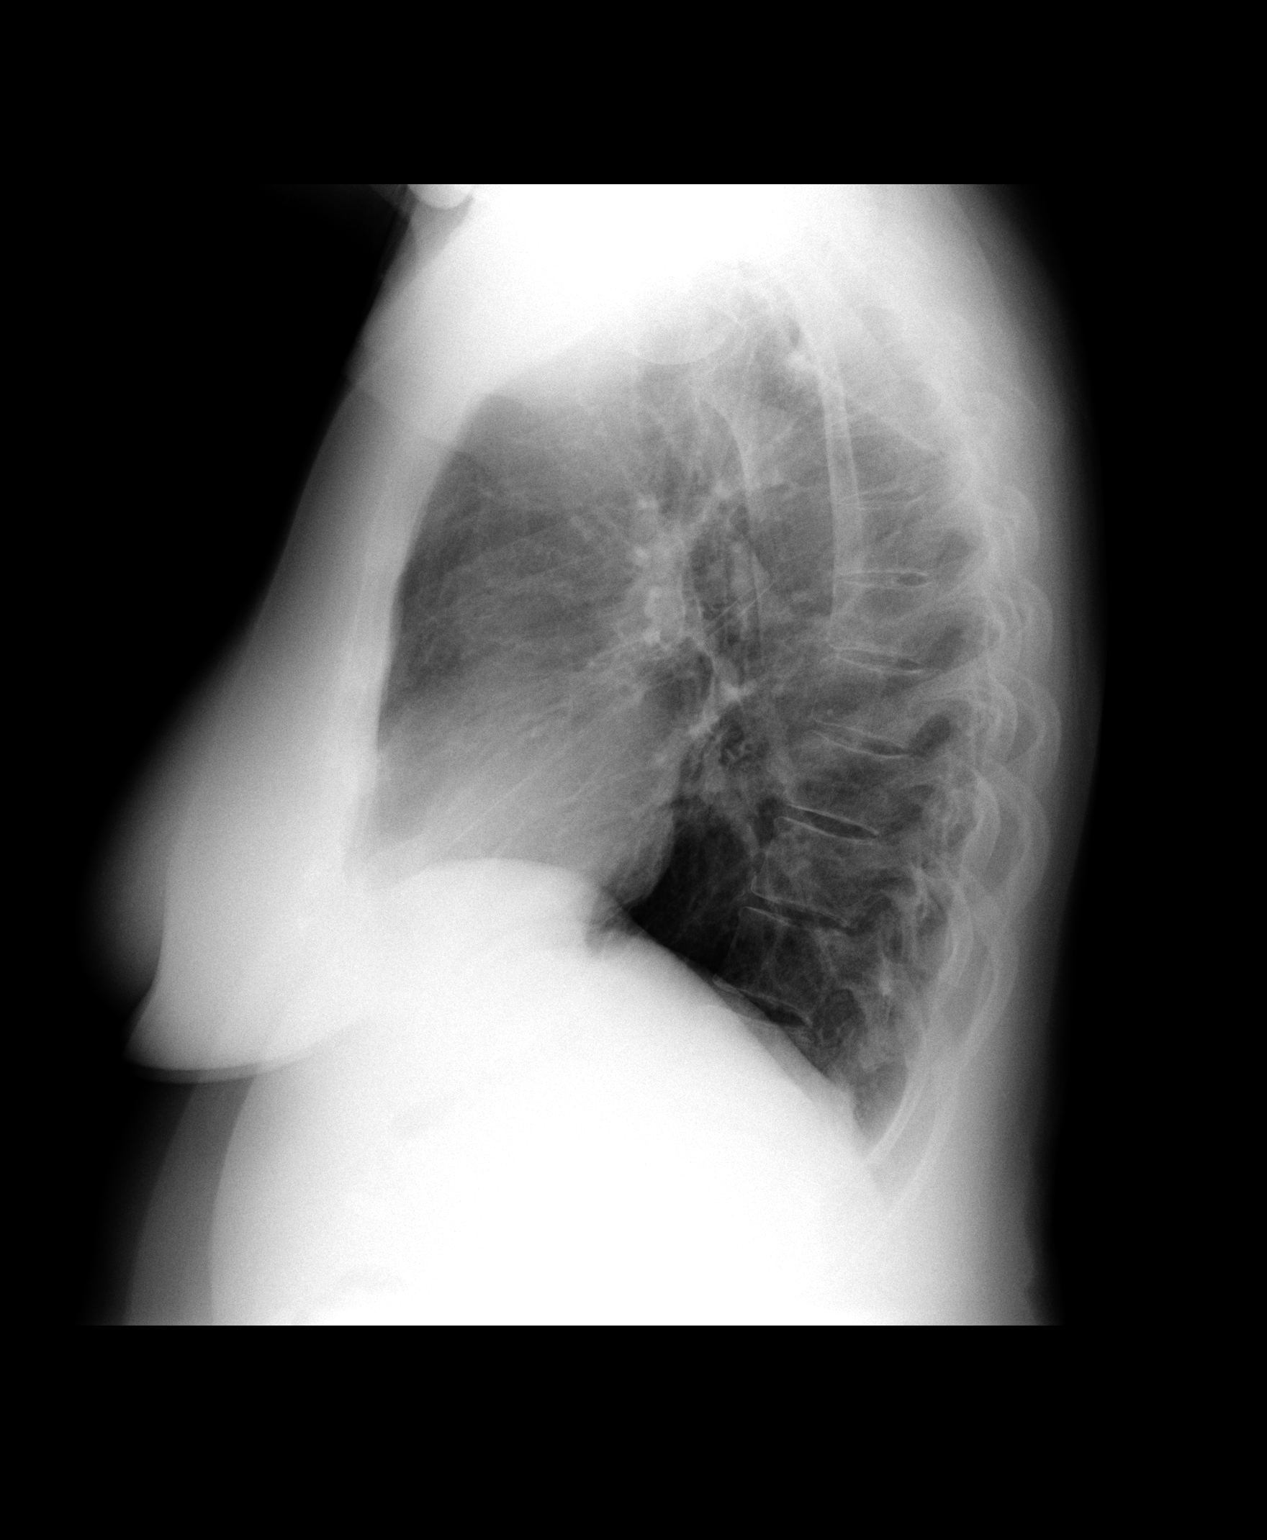

[2 of 2 positions shown; findings below may reference images not displayed]

FINDINGS: The lungs are clear.  Mediastinal contours are normal.
The heart is within normal limits in size.  No acute bony
abnormality is seen.
IMPRESSION: No active lung disease.

## 2011-08-28 LAB — OVA AND PARASITE EXAMINATION

## 2011-08-28 LAB — FECAL LACTOFERRIN, QUANT: Fecal Lactoferrin: NEGATIVE

## 2011-08-28 LAB — STOOL CULTURE

## 2011-08-28 LAB — CLOSTRIDIUM DIFFICILE EIA

## 2012-03-25 ENCOUNTER — Other Ambulatory Visit (HOSPITAL_COMMUNITY): Payer: Self-pay | Admitting: Internal Medicine

## 2012-03-25 DIAGNOSIS — M25559 Pain in unspecified hip: Secondary | ICD-10-CM

## 2012-03-30 ENCOUNTER — Ambulatory Visit (HOSPITAL_COMMUNITY)
Admission: RE | Admit: 2012-03-30 | Discharge: 2012-03-30 | Disposition: A | Discharge status: Home / Self Care | Payer: 59 | Source: Ambulatory Visit | Attending: Internal Medicine | Admitting: Internal Medicine

## 2012-03-30 DIAGNOSIS — M545 Low back pain, unspecified: Secondary | ICD-10-CM | POA: Insufficient documentation

## 2012-03-30 DIAGNOSIS — M25559 Pain in unspecified hip: Secondary | ICD-10-CM

## 2012-03-30 DIAGNOSIS — M48061 Spinal stenosis, lumbar region without neurogenic claudication: Secondary | ICD-10-CM | POA: Insufficient documentation

## 2012-03-30 DIAGNOSIS — M79609 Pain in unspecified limb: Secondary | ICD-10-CM | POA: Insufficient documentation

## 2012-05-17 ENCOUNTER — Other Ambulatory Visit (HOSPITAL_COMMUNITY): Payer: Self-pay | Admitting: Internal Medicine

## 2012-05-17 DIAGNOSIS — Z139 Encounter for screening, unspecified: Secondary | ICD-10-CM

## 2012-05-27 ENCOUNTER — Ambulatory Visit (HOSPITAL_COMMUNITY)
Admission: RE | Admit: 2012-05-27 | Discharge: 2012-05-27 | Disposition: A | Discharge status: Home / Self Care | Payer: Medicare Other | Source: Ambulatory Visit | Attending: Internal Medicine | Admitting: Internal Medicine

## 2012-05-27 DIAGNOSIS — Z139 Encounter for screening, unspecified: Secondary | ICD-10-CM

## 2012-05-27 DIAGNOSIS — Z1231 Encounter for screening mammogram for malignant neoplasm of breast: Secondary | ICD-10-CM | POA: Insufficient documentation

## 2013-01-17 ENCOUNTER — Encounter (INDEPENDENT_AMBULATORY_CARE_PROVIDER_SITE_OTHER): Payer: Self-pay | Admitting: Internal Medicine

## 2013-01-17 ENCOUNTER — Ambulatory Visit (INDEPENDENT_AMBULATORY_CARE_PROVIDER_SITE_OTHER): Payer: 59 | Admitting: Internal Medicine

## 2013-01-17 VITALS — BP 118/70 | HR 78 | Temp 97.4°F | Resp 18 | Ht <= 58 in | Wt 142.9 lb

## 2013-01-17 DIAGNOSIS — K219 Gastro-esophageal reflux disease without esophagitis: Secondary | ICD-10-CM

## 2013-01-17 DIAGNOSIS — K589 Irritable bowel syndrome without diarrhea: Secondary | ICD-10-CM | POA: Insufficient documentation

## 2013-01-17 NOTE — Progress Notes (Signed)
Presenting complaint;  Epigastric pain and irregular bowel movements.  History of present illness;  Patient is 79 year old Caucasian female patient of Dr. Carylon Perches who is self referred for GI evaluation. She says she's been suffering with burning epigastric pain for several years. Back in July 2007 she had EGD by Dr. Jena Gauss and was noted to have erosion at GE junction but did not respond to PPI therapy. Repeat EGD in December 2011 revealed small sliding hiatal hernia but no evidence of erosive esophagitis. She also took sucralfate for about a year but without symptomatic relief. She underwent laparoscopic cholecystectomy in may 2012 for low ejection fraction but once again it did not ameliorate her pain. Over the last few years she has lived off bland foods without spices or seasonings even then she would have severe postprandial midepigastric burning which would last for 2-3 hours to be followed by aching pain which would last all day long. She has experienced very little nausea without vomiting. Similarly she rarely has retrosternal burning or heartburn. She also complains of irregular bowel habits. She there has diarrhea and/or constipation.  She was seen by Dr. Ouida Sills last week. She CBC and comprehensive chemistry panel. She was begun on ranitidine at bedtime and dicyclomine 10 mg twice daily. Now she feels much better. Now she is able to eat toast with cheese without any symptoms. She is questioning why she has not been treated with this medication in the past. She's always maintained good appetite and her weight has been stable. She has been on lorazepam for several years. She generally takes 2 mg at bedtime but every now and then she will take half the dose in the morning and would notice some relief and epigastric burning. She also has tried probiotic in the past but without symptomatic improvement. Since she has been on dicyclomine she has had loose stools. She denies melena or rectal bleeding. She  has noted dry mouth when she wakes up in the morning she is taking second dose of dicyclomine at 9 PM.  Current Medications: Current Outpatient Prescriptions  Medication Sig Dispense Refill  . Alum Hydroxide-Mag Carbonate (GAVISCON PO) Take by mouth as needed.        Marland Kitchen aspirin 81 MG tablet Take 81 mg by mouth daily.        Marland Kitchen atorvastatin (LIPITOR) 10 MG tablet Take 10 mg by mouth every other day.       . Calcium Carbonate Antacid (TUMS PO) Take by mouth as needed.        . dicyclomine (BENTYL) 10 MG capsule Take 10 mg by mouth 2 (two) times daily.       Marland Kitchen esomeprazole (NEXIUM) 40 MG capsule Take 40 mg by mouth daily before breakfast.      . LORazepam (ATIVAN) 2 MG tablet Take 2 mg by mouth 2 (two) times daily.       . Metoprolol Succinate (TOPROL XL PO) Take 50 mg by mouth 3 (three) times daily.       . ranitidine (ZANTAC) 150 MG tablet Take 150 mg by mouth at bedtime.      . Triamterene-HCTZ (MAXZIDE PO) Take by mouth daily. Patient states that she is taking 37.5-25 mg daily       No current facility-administered medications for this visit.   Past medical history; Irritable bowel syndrome. Stress disorder for 30 years. She's been hypertensive for about 15 years. Hyperlipidemia. Chronic GERD. EGD in July 2007 revealed single erosion at [facility]. EGD in December  2011 revealed small sliding heart hernia. Colonoscopy in December 2008 revealed single small tubular adenoma. Biopsies from the sigmoid colon were negative for microscopic colitis. She had left-sided diverticula. Colonoscopy in December 2011 was negative for recurrent polyps. Spinal stenosis diagnosed 5 years. She exercises regularly which helps alleviate her pain. Bilateral breast biopsy for benign lesions in 1964. Left cataract extraction in 2007. Laparoscopic cholecystectomy May 2012.  Allergies; Tricyclic antidepressants.   Family history; Father died of atherosclerotic heart disease at age 80. Mother developed dementia at  age 44 and died at age 77. She does not have any siblings.  Social history; She is married. 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 She has one son in good health. She has never smoked cigarettes drinks alcohol one to 2 drinks per week    Objective: Blood pressure 118/70, pulse 78, temperature 97.4 F (36.3 C), temperature source Oral, resp. rate 18, height 4\' 10"  (1.473 m), weight 142 lb 14.4 oz (64.819 kg). Patient is alert and in no acute distress. Conjunctiva is pink. Sclera is nonicteric Oropharyngeal mucosa is normal. No neck masses or thyromegaly noted. Cardiac exam with regular rhythm normal S1 and S2. No murmur or gallop noted. Lungs are clear to auscultation. Abdomen is full. Bowel sounds are normal. No bruits noted. Abdomen is soft and nontender without organomegaly or masses.  No LE edema or clubbing noted.   Assessment:  #1. Epigastric pain unresponsive to PPI therapy but  pain has been completely abolished with dicyclomine. Therefore this pain would appear to be secondary to IBS or dyspepsia. She is having dry mouth and may want to take second dose before lunch or evening meal rather than at bedtime. She also needs to make sure she does not become constipated. #2. Chronic GERD. We may be able to reduce PPI dose in future.   Recommendations; Continue current therapy until office visit in 8 weeks. Take second dose of dicyclomine either before lunch or evening meal. Increase intake of fiber rich foods.

## 2013-01-17 NOTE — Patient Instructions (Signed)
Take dicyclomine 10 mg before breakfast and evening meal or before breakfast and lunch. Gradually increase use of fiber rich foods.

## 2013-02-07 ENCOUNTER — Encounter (INDEPENDENT_AMBULATORY_CARE_PROVIDER_SITE_OTHER): Payer: Self-pay

## 2013-03-21 ENCOUNTER — Ambulatory Visit (INDEPENDENT_AMBULATORY_CARE_PROVIDER_SITE_OTHER): Payer: 59 | Admitting: Internal Medicine

## 2013-03-21 ENCOUNTER — Encounter (INDEPENDENT_AMBULATORY_CARE_PROVIDER_SITE_OTHER): Payer: Self-pay | Admitting: Internal Medicine

## 2013-03-21 VITALS — BP 124/70 | HR 78 | Temp 98.0°F | Resp 18 | Ht <= 58 in | Wt 143.8 lb

## 2013-03-21 DIAGNOSIS — K589 Irritable bowel syndrome without diarrhea: Secondary | ICD-10-CM

## 2013-03-21 DIAGNOSIS — K219 Gastro-esophageal reflux disease without esophagitis: Secondary | ICD-10-CM

## 2013-03-21 MED ORDER — RANITIDINE HCL 150 MG PO TABS: 150.0000 mg | ORAL_TABLET | Freq: Every day | ORAL | Status: DC | PRN

## 2013-03-21 NOTE — Progress Notes (Signed)
Presenting complaint;  Follow for GERD and IBS.  Subjective:  Patient is 79 year old Caucasian female who is here for scheduled visit. She was last seen on 01/17/2013 for epigastric pain and diarrhea responding very nicely to dicyclomine which was begun by Dr. Ouida Sills just prior to her last visit. She states dicyclomine is still working. Her heartburn is well controlled with therapy. She is using times normal than once or twice a week. She generally has 1-2 soft stools per day. He denies melena or rectal bleeding. For the last 3-4 weeks she has been experiencing palpitation and lightheadedness. These symptoms generally occur in the afternoon. She saw Dr. Ouida Sills yesterday. She was begun on potassium and beta blocker dose was increased. She has not experienced palpitations today. She denies dry mouth or constipation. She is wondering if these symptoms are secondary to dicyclomine.  Current Medications: Current Outpatient Prescriptions  Medication Sig Dispense Refill  . Alum Hydroxide-Mag Carbonate (GAVISCON PO) Take by mouth as needed.        Marland Kitchen aspirin 81 MG tablet Take 81 mg by mouth daily.        Marland Kitchen atorvastatin (LIPITOR) 10 MG tablet Take 10 mg by mouth every other day.       . Calcium Carbonate Antacid (TUMS PO) Take by mouth as needed.        . dicyclomine (BENTYL) 10 MG capsule Take 10 mg by mouth 2 (two) times daily.       Marland Kitchen esomeprazole (NEXIUM) 40 MG capsule Take 40 mg by mouth daily before breakfast.      . KLOR-CON M20 20 MEQ tablet Take 20 mEq by mouth daily.       Marland Kitchen LORazepam (ATIVAN) 2 MG tablet Take 2 mg by mouth 2 (two) times daily.       . metoprolol (LOPRESSOR) 50 MG tablet 200 mg daily.       . ranitidine (ZANTAC) 150 MG tablet Take 150 mg by mouth at bedtime.      . Triamterene-HCTZ (MAXZIDE PO) Take by mouth daily. Patient states that she is taking 37.5-25 mg daily       No current facility-administered medications for this visit.     Objective: Blood pressure 124/70,  pulse 78, temperature 98 F (36.7 C), temperature source Oral, resp. rate 18, height 4\' 10"  (1.473 m), weight 143 lb 12.8 oz (65.227 kg). Patient is alert and in no acute distress. Conjunctiva is pink. Sclera is nonicteric Oropharyngeal mucosa is normal. No neck masses or thyromegaly noted. Cardiac exam with regular rhythm normal S1 and S2. No murmur or gallop noted. Lungs are clear to auscultation. Abdomen is soft and nontender without organomegaly or masses.  No LE edema or clubbing noted.   Assessment:  #1. Irritable bowel syndrome presenting with epigastric pain and diarrhea with excellent response to dicyclomine. Lately she's been experiencing palpitations or lightheadedness which may or may not be related to dicyclomine. If these symptoms persist despite interventions by Dr. Ouida Sills she may consider dropping morning dose of dicyclomine and see what happens. #2. Chronic GERD. Symptoms are well controlled with therapy. She can use H2 B. on when necessary basis rather than daily.    Plan:  Take Zantac 150 mg by mouth each bedtime when necessary. Consider dropping a.m. dose of dicyclomine if palpitation and lightheadedness persists. May drop dose starting next week. Call office with progress report in 2 to 3 weeks. Office visit in 6 months.

## 2013-03-21 NOTE — Patient Instructions (Signed)
If palpitations persist, consider dropping a.m. dose dicyclomine. Use Zantac or ranitidine on an as-needed basis.

## 2013-04-27 ENCOUNTER — Other Ambulatory Visit (HOSPITAL_COMMUNITY): Payer: Self-pay | Admitting: Internal Medicine

## 2013-04-27 DIAGNOSIS — Z139 Encounter for screening, unspecified: Secondary | ICD-10-CM

## 2013-05-30 ENCOUNTER — Ambulatory Visit (HOSPITAL_COMMUNITY)
Admission: RE | Admit: 2013-05-30 | Discharge: 2013-05-30 | Disposition: A | Discharge status: Home / Self Care | Payer: 59 | Source: Ambulatory Visit | Attending: Internal Medicine | Admitting: Internal Medicine

## 2013-05-30 DIAGNOSIS — Z139 Encounter for screening, unspecified: Secondary | ICD-10-CM

## 2013-05-30 DIAGNOSIS — Z1231 Encounter for screening mammogram for malignant neoplasm of breast: Secondary | ICD-10-CM | POA: Insufficient documentation

## 2013-09-19 ENCOUNTER — Ambulatory Visit (INDEPENDENT_AMBULATORY_CARE_PROVIDER_SITE_OTHER): Payer: 59 | Admitting: Internal Medicine

## 2013-09-26 ENCOUNTER — Ambulatory Visit (HOSPITAL_COMMUNITY)
Admission: RE | Admit: 2013-09-26 | Discharge: 2013-09-26 | Disposition: A | Discharge status: Home / Self Care | Payer: Medicare Other | Source: Ambulatory Visit | Attending: Internal Medicine | Admitting: Internal Medicine

## 2013-09-26 DIAGNOSIS — K219 Gastro-esophageal reflux disease without esophagitis: Secondary | ICD-10-CM | POA: Insufficient documentation

## 2013-09-26 DIAGNOSIS — R002 Palpitations: Secondary | ICD-10-CM | POA: Insufficient documentation

## 2013-09-26 DIAGNOSIS — I517 Cardiomegaly: Secondary | ICD-10-CM

## 2013-09-26 DIAGNOSIS — E785 Hyperlipidemia, unspecified: Secondary | ICD-10-CM | POA: Insufficient documentation

## 2013-09-26 DIAGNOSIS — I10 Essential (primary) hypertension: Secondary | ICD-10-CM | POA: Insufficient documentation

## 2013-09-26 NOTE — Progress Notes (Signed)
*  PRELIMINARY RESULTS* Echocardiogram 2D Echocardiogram has been performed.  [name] 09/26/2013, 3:43 PM

## 2014-05-24 ENCOUNTER — Other Ambulatory Visit (HOSPITAL_COMMUNITY): Payer: Self-pay | Admitting: Internal Medicine

## 2014-05-24 DIAGNOSIS — Z1231 Encounter for screening mammogram for malignant neoplasm of breast: Secondary | ICD-10-CM

## 2014-05-31 ENCOUNTER — Ambulatory Visit (HOSPITAL_COMMUNITY)
Admission: RE | Admit: 2014-05-31 | Discharge: 2014-05-31 | Disposition: A | Discharge status: Home / Self Care | Payer: Medicare Other | Source: Ambulatory Visit | Attending: Internal Medicine | Admitting: Internal Medicine

## 2014-05-31 DIAGNOSIS — Z1231 Encounter for screening mammogram for malignant neoplasm of breast: Secondary | ICD-10-CM | POA: Insufficient documentation

## 2014-10-23 ENCOUNTER — Encounter: Payer: Self-pay | Admitting: Cardiology

## 2014-10-23 ENCOUNTER — Ambulatory Visit (INDEPENDENT_AMBULATORY_CARE_PROVIDER_SITE_OTHER): Payer: 59 | Admitting: Cardiology

## 2014-10-23 VITALS — BP 124/78 | HR 77 | Ht <= 58 in | Wt 143.0 lb

## 2014-10-23 DIAGNOSIS — I493 Ventricular premature depolarization: Secondary | ICD-10-CM

## 2014-10-23 DIAGNOSIS — I10 Essential (primary) hypertension: Secondary | ICD-10-CM | POA: Insufficient documentation

## 2014-10-23 DIAGNOSIS — R002 Palpitations: Secondary | ICD-10-CM

## 2014-10-23 NOTE — Assessment & Plan Note (Signed)
Blood pressure is well-controlled today. 

## 2014-10-23 NOTE — Progress Notes (Signed)
Reason for visit: Palpitations  Clinical Summary Ms. Behrendt is an 80 y.o.female referred for cardiology consultation by Dr. Willey Blade. She reports at least a two-month history of increasing palpitations that happen sporadically, although usually in the afternoons, and are associated with a feeling of shortness of breath. She has had no chest pain or syncope with these symptoms. Records indicate history of symptomatic PVCs documented in the past, she has been on relatively high-dose beta blocker and calcium channel blocker for some time. Recently changed to verapamil from diltiazem and has continued on Toprol-XL, seems to have improved symptoms of late. She has not worn a cardiac monitor and quite some time. 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  She states that blood pressure control has been good overall.other than beta blocker and calcium channel blocker, she continues on Maxide.  Lab work from earlier in the year showed potassium 4.3, BUN 18, creatinine 0.8, hemoglobin 13.6, platelets 216, AST 13, ALT 16, cholesterol 181, triglycerides 92, HDL 62, LDL 101. ECG from November showed normal sinus rhythm with small R' in lead V1, nonspecific ST changes.  Echocardiogram from November 2014 showed mild LVH with LVEF 76-19%, grade 1 diastolic dysfunction with increased filling pressures, no major valvular abnormalities, unable to assess PASP, prominent epicardial fat-pad.   Allergies  Allergen Reactions  . Tricyclic Antidepressants     Current Outpatient Prescriptions  Medication Sig Dispense Refill  . aspirin 81 MG tablet Take 81 mg by mouth daily.      Marland Kitchen atorvastatin (LIPITOR) 10 MG tablet Take 5 mg by mouth every other day.     . Calcium Carbonate Antacid (TUMS PO) Take by mouth as needed.      Marland Kitchen esomeprazole (NEXIUM) 40 MG capsule Take 40 mg by mouth daily before breakfast.    . KLOR-CON M20 20 MEQ tablet Take 20 mEq by mouth 2 (two)  times daily.     Marland Kitchen LORazepam (ATIVAN) 2 MG tablet Take 2 mg by mouth 2 (two) times daily.     . metoprolol (TOPROL-XL) 200 MG 24 hr tablet Take 200 mg by mouth daily.    . Triamterene-HCTZ (MAXZIDE PO) Take by mouth daily. Patient states that she is taking 37.5-25 mg daily    . verapamil (CALAN) 40 MG tablet Take 40 mg by mouth 2 (two) times daily as needed.     No current facility-administered medications for this visit.    Past Medical History  Diagnosis Date  . GERD (gastroesophageal reflux disease)   . Essential hypertension   . Hyperlipidemia   . Anxiety   . Spinal stenosis   . Palpitations     Reportedly symptomatic PVCs     Past Surgical History  Procedure Laterality Date  . Cholecystectomy      04/07/2011  . Colonoscopy      11/04/2010 Dr.Rourk  . Upper gastrointestinal endoscopy    . Dilation and curettage of uterus  1972  . Bilateral breast cyst  1964  . Caturact surgery  2010    Family History  Problem Relation Age of Onset  . Healthy Son   . Heart disease Father   . Dementia Mother     Social History Ms. Doan reports that she has never smoked. She has never used smokeless tobacco. Ms. Filkins reports that she drinks alcohol.  Review of Systems Complete review of systems negative except as otherwise outlined in the clinical summary and also the following.no fevers  or chills, no orthopnea or PND. More short of breath over the years with activity but not suddenly progressive.  Physical Examination Filed Vitals:   10/23/14 1326  BP: 124/78  Pulse: 77   Filed Weights   10/23/14 1326  Weight: 143 lb (64.864 kg)   Well-nourished elderly woman, appears younger than stated age. HEENT: Conjunctiva and lids normal, oropharynx clear. Neck: Supple, no elevated JVP or carotid bruits, no thyromegaly. Lungs: Clear to auscultation, nonlabored breathing at rest. Cardiac: Regular rate and rhythm, no S3 or significant systolic murmur, no pericardial  rub. Abdomen: Soft, nontender, bowel sounds present. Extremities: No pitting edema, distal pulses 2+. Skin: Warm and dry. Musculoskeletal: No kyphosis. Neuropsychiatric: Alert and oriented x3, affect grossly appropriate.   Problem List and Plan   Palpitations Reports increasing frequency over the last few months as outlined. Her most recent medication changes seem to be helping, although she does continue to have some symptoms. No changes made to Toprol-XL 200 mg in the morning with as needed verapamil, although verapamil could be used as a regular standing medication if needed. We will plan to obtain a seven-day cardiac monitor to document whether this is simply more symptomatic PVCs, or something different that she is experiencing. We will call her with the results.  Symptomatic PVCs Documented in the past, follow-up monitoring to be obtained.  Essential hypertension Blood pressure is well controlled today.    Satira Sark, M.D., F.A.C.C.

## 2014-10-23 NOTE — Assessment & Plan Note (Signed)
Documented in the past, follow-up monitoring to be obtained.

## 2014-10-23 NOTE — Patient Instructions (Signed)
Your physician recommends that you schedule a follow-up appointment in: to be determined after event monitor results    Your physician recommends that you continue on your current medications as directed. Please refer to the Current Medication list given to you today.     Your physician has recommended that you wear an event monitor for 7 days. Event monitors are medical devices that record the heart's electrical activity. Doctors most often [place] these monitors to diagnose arrhythmias. Arrhythmias are problems with the speed or rhythm of the heartbeat. The monitor is a small, portable device. You can wear one while you do your normal daily activities. This is usually used to diagnose what is causing palpitations/syncope (passing out).      Thank you for choosing [facility] !

## 2014-10-23 NOTE — Assessment & Plan Note (Signed)
Reports increasing frequency over the last few months as outlined. Her most recent medication changes seem to be helping, although she does continue to have some symptoms. No changes made to Toprol-XL 200 mg in the morning with as needed verapamil, although verapamil could be used as a regular standing medication if needed. We will plan to obtain a seven-day cardiac monitor to document whether this is simply more symptomatic PVCs, or something different that she is experiencing. We will call her with the results.

## 2014-10-31 ENCOUNTER — Telehealth: Payer: Self-pay

## 2014-10-31 NOTE — Telephone Encounter (Signed)
EOS event monitor report placed in Dr.McDowell's folder for review

## 2014-11-05 ENCOUNTER — Telehealth: Payer: Self-pay

## 2014-11-05 ENCOUNTER — Other Ambulatory Visit: Payer: Self-pay | Admitting: *Deleted

## 2014-11-05 DIAGNOSIS — R002 Palpitations: Secondary | ICD-10-CM

## 2014-11-05 DIAGNOSIS — I493 Ventricular premature depolarization: Secondary | ICD-10-CM

## 2014-11-05 NOTE — Telephone Encounter (Signed)
Notified pt of event results,pt states she saw Dr.Fagan last week and he added Verapamil ER 200 mg daily and patient feels washed out and palpitations are the same

## 2014-11-05 NOTE — Telephone Encounter (Signed)
Pt called back to say Dr Willey Blade had put her on Verapamil 120 mg NOT 200 mg

## 2014-11-05 NOTE — Telephone Encounter (Signed)
I was under the impression that she was on Toprol-XL 200 mg daily with verapamil 40 mg twice a day as needed. Verapamil could perhaps be used more consistently if she has not been doing this. If she remains symptomatic on both medications used regularly, we could always plan to see her back and discuss other options.

## 2014-11-05 NOTE — Telephone Encounter (Signed)
She is still on Toprol,he told her to take the Verapamil 120 mg daily and add 40 mg dose if sx's were particularly bothersome

## 2014-11-05 NOTE — Telephone Encounter (Signed)
-----   Message from Satira Sark, MD sent at 11/05/2014 12:48 PM EST ----- Reviewed. Please let her know that rhythm was normal, she did had PVCs which have been documented previously and I suspect are related to her feelings of palpitations. She was doing better at the visit recently. No changes in medications now unless symptoms worsen.

## 2014-11-06 NOTE — Telephone Encounter (Signed)
Pt chooses to stay on Verapamil 120 mg daily along with Toprol 200 mg daily and will use Verapamil 40 mg if needed as Dr.Fagan had instructed.I suggested she take verapamil in the am and Toprol at nite and she reports having much more energy today

## 2015-04-18 ENCOUNTER — Encounter: Payer: Self-pay | Admitting: Adult Health

## 2015-04-18 ENCOUNTER — Ambulatory Visit (INDEPENDENT_AMBULATORY_CARE_PROVIDER_SITE_OTHER): Payer: Medicare Other | Admitting: Adult Health

## 2015-04-18 VITALS — BP 120/60 | HR 52 | Ht <= 58 in | Wt 142.0 lb

## 2015-04-18 DIAGNOSIS — N952 Postmenopausal atrophic vaginitis: Secondary | ICD-10-CM

## 2015-04-18 DIAGNOSIS — L298 Other pruritus: Secondary | ICD-10-CM

## 2015-04-18 DIAGNOSIS — N898 Other specified noninflammatory disorders of vagina: Secondary | ICD-10-CM

## 2015-04-18 HISTORY — DX: Other specified noninflammatory disorders of vagina: N89.8

## 2015-04-18 HISTORY — DX: Postmenopausal atrophic vaginitis: N95.2

## 2015-04-18 LAB — POCT WET PREP (WET MOUNT): WBC, Wet Prep HPF POC: POSITIVE

## 2015-04-18 MED ORDER — ESTRADIOL 0.1 MG/GM VA CREA: TOPICAL_CREAM | VAGINAL | Status: DC

## 2015-04-18 MED ORDER — NYSTATIN-TRIAMCINOLONE 100000-0.1 UNIT/GM-% EX OINT: 1.0000 "application " | TOPICAL_OINTMENT | Freq: Two times a day (BID) | CUTANEOUS | Status: DC

## 2015-04-18 NOTE — Progress Notes (Signed)
Subjective:     Patient ID: [name], female   DOB: 1930/12/31, 81 y.o.   MRN: [id number]  HPI [name] is a 81 year old white female, married in complaining of vaginal and perianal itching, "from clitoris to top of butt crack", it comes and goes.She has tried [employer] and it helped a little. PCP is Dr Willey Blade and cardiologist is Dr Domenic Polite.  Review of Systems + Vaginal itching, has some mixed UI at times, and she wears a pad, no longer has sex, husband has some dementia, all other systems negative.  Reviewed past medical,surgical, social and family history. Reviewed medications and allergies.     Objective:   Physical Exam BP 120/60 mmHg  Pulse 52  Ht 4\' 10"  (1.473 m)  Wt 142 lb (64.411 kg)  BMI 29.69 kg/m2 Skin warm and dry.Pelvic: external genitalia is normal in appearance for age,skin is thin, no lesions, vagina has decreased color, moisture and rugae,urethra has no lesions or masses noted, cervix:atrophic, uterus: normal size, shape and contour, non tender, no masses felt, adnexa: no masses or tenderness noted. Bladder is non tender and no masses felt. Wet prep:  +WBCs.Discussed with Dr Elonda Husky and he agrees with estrace cream.     Assessment:     Vaginal itch Vaginal atrophy     Plan:    Given 4 tubes of estrace vaginal cream to use pea size amount in vagina at hs x 7 days then use 2-3 x weekly  Lot [id number] exp 9/18 Rx mytrex ointment to use 2-3 x daily prn to affected area Follow up prn

## 2015-04-18 NOTE — Patient Instructions (Signed)
Use pea size amount in vagina a bedtime  For 7 days then use 2-3 x weekly Use mytrex 2-3 x daily  Follow up prn

## 2015-06-04 ENCOUNTER — Other Ambulatory Visit (HOSPITAL_COMMUNITY): Payer: Self-pay | Admitting: Internal Medicine

## 2015-06-04 DIAGNOSIS — Z1231 Encounter for screening mammogram for malignant neoplasm of breast: Secondary | ICD-10-CM

## 2015-06-13 ENCOUNTER — Ambulatory Visit (HOSPITAL_COMMUNITY)
Admission: RE | Admit: 2015-06-13 | Discharge: 2015-06-13 | Disposition: A | Discharge status: Home / Self Care | Payer: Medicare Other | Source: Ambulatory Visit | Attending: Internal Medicine | Admitting: Internal Medicine

## 2015-06-13 DIAGNOSIS — Z1231 Encounter for screening mammogram for malignant neoplasm of breast: Secondary | ICD-10-CM | POA: Insufficient documentation

## 2015-10-08 ENCOUNTER — Other Ambulatory Visit: Payer: Self-pay | Admitting: Adult Health

## 2015-12-12 DIAGNOSIS — M545 Low back pain: Secondary | ICD-10-CM | POA: Diagnosis not present

## 2015-12-12 DIAGNOSIS — M4806 Spinal stenosis, lumbar region: Secondary | ICD-10-CM | POA: Diagnosis not present

## 2015-12-26 DIAGNOSIS — H16223 Keratoconjunctivitis sicca, not specified as Sjogren's, bilateral: Secondary | ICD-10-CM | POA: Diagnosis not present

## 2016-01-27 DIAGNOSIS — K219 Gastro-esophageal reflux disease without esophagitis: Secondary | ICD-10-CM | POA: Diagnosis not present

## 2016-01-27 DIAGNOSIS — Z79899 Other long term (current) drug therapy: Secondary | ICD-10-CM | POA: Diagnosis not present

## 2016-01-27 DIAGNOSIS — E785 Hyperlipidemia, unspecified: Secondary | ICD-10-CM | POA: Diagnosis not present

## 2016-01-27 DIAGNOSIS — G4700 Insomnia, unspecified: Secondary | ICD-10-CM | POA: Diagnosis not present

## 2016-02-03 DIAGNOSIS — E785 Hyperlipidemia, unspecified: Secondary | ICD-10-CM | POA: Diagnosis not present

## 2016-02-03 DIAGNOSIS — I10 Essential (primary) hypertension: Secondary | ICD-10-CM | POA: Diagnosis not present

## 2016-02-03 DIAGNOSIS — Z0001 Encounter for general adult medical examination with abnormal findings: Secondary | ICD-10-CM | POA: Diagnosis not present

## 2016-02-03 DIAGNOSIS — Z6830 Body mass index (BMI) 30.0-30.9, adult: Secondary | ICD-10-CM | POA: Diagnosis not present

## 2016-02-24 DIAGNOSIS — H25811 Combined forms of age-related cataract, right eye: Secondary | ICD-10-CM | POA: Diagnosis not present

## 2016-02-24 DIAGNOSIS — H401414 Capsular glaucoma with pseudoexfoliation of lens, right eye, indeterminate stage: Secondary | ICD-10-CM | POA: Diagnosis not present

## 2016-02-24 DIAGNOSIS — Z961 Presence of intraocular lens: Secondary | ICD-10-CM | POA: Diagnosis not present

## 2016-02-25 NOTE — Patient Instructions (Signed)
[name]  02/25/2016     @PREFPERIOPPHARMACY @   Your procedure is scheduled on 03/02/2016.  Report to [facility] at 11:30 A.M.  Call this number if you have problems the morning of surgery:  [phone]   Remember:  Do not eat food or drink liquids after midnight.  Take these medicines the morning of surgery with A SIP OF WATER Nexium, Hydrocodone, Ativan, Toprol, Verapamil   Do not wear jewelry, make-up or nail polish.  Do not wear lotions, powders, or perfumes.  You may wear deodorant.  Do not shave 48 hours prior to surgery.  Men may shave face and neck.  Do not bring valuables to the hospital.  [facility] is not responsible for any belongings or valuables.  Contacts, dentures or bridgework may not be worn into surgery.  Leave your suitcase in the car.  After surgery it may be brought to your room.  For patients admitted to the hospital, discharge time will be determined by your treatment team.  Patients discharged the day of surgery will not be allowed to drive home.    Please read over the following fact sheets that you were given. Anesthesia Post-op Instructions     PATIENT INSTRUCTIONS POST-ANESTHESIA  IMMEDIATELY FOLLOWING SURGERY:  Do not drive or operate machinery for the first twenty four hours after surgery.  Do not make any important decisions for twenty four hours after surgery or while taking narcotic pain medications or sedatives.  If you develop intractable nausea and vomiting or a severe headache please notify your doctor immediately.  FOLLOW-UP:  Please make an appointment with your surgeon as instructed. You do not need to follow up with anesthesia unless specifically instructed to do so.  WOUND CARE INSTRUCTIONS (if applicable):  Keep a dry clean dressing on the anesthesia/puncture wound site if there is drainage.  Once the wound has quit draining you may leave it open to air.  Generally you should leave the bandage intact for twenty four hours  unless there is drainage.  If the epidural site drains for more than 36-48 hours please call the anesthesia department.  QUESTIONS?:  Please feel free to call your physician or the hospital operator if you have any questions, and they will be happy to assist you.       A cataract is a clouding of the lens of the eye. When a lens becomes cloudy, vision is reduced based on the degree and nature of the clouding. Surgery may be needed to improve vision. Surgery removes the cloudy lens and usually replaces it with a substitute lens (intraocular lens, IOL). LET YOUR EYE DOCTOR KNOW ABOUT:  Allergies to food or medicine.  Medicines taken including herbs, eye drops, over-the-counter medicines, and creams.  Use of steroids (by mouth or creams).  Previous problems with anesthetics or numbing medicine.  History of bleeding problems or blood clots.  Previous surgery.  Other health problems, including diabetes and kidney problems.  Possibility of pregnancy, if this applies. RISKS AND COMPLICATIONS  Infection.  Inflammation of the eyeball (endophthalmitis) that can spread to both eyes (sympathetic ophthalmia).  Poor wound healing.  If an IOL is inserted, it can later fall out of proper position. This is very uncommon.  Clouding of the part of your eye that holds an IOL in place. This is called an "after-cataract." These are uncommon but easily treated. BEFORE THE PROCEDURE  Do not eat or drink anything except small amounts of water for 8 to 12  before your surgery, or as directed by your caregiver.  Unless you are told otherwise, continue any eye drops you have been prescribed.  Talk to your primary caregiver about all other medicines that you take (both prescription and nonprescription). In some cases, you may need to stop or change medicines near the time of your surgery. This is most important if you are taking blood-thinning medicine.Do not stop medicines unless you are told to do  so.  Arrange for someone to drive you to and from the procedure.  Do not put contact lenses in either eye on the day of your surgery. PROCEDURE There is more than one method for safely removing a cataract. Your doctor can explain the differences and help determine which is best for you. Phacoemulsification surgery is the most common form of cataract surgery.  An injection is given behind the eye or eye drops are given to make this a painless procedure.  A small cut (incision) is made on the edge of the clear, dome-shaped surface that covers the front of the eye (cornea).  A tiny probe is painlessly inserted into the eye. This device gives off ultrasound waves that soften and break up the cloudy center of the lens. This makes it easier for the cloudy lens to be removed by suction.  An IOL may be implanted.  The normal lens of the eye is covered by a clear capsule. Part of that capsule is intentionally left in the eye to support the IOL.  Your surgeon may or may not use stitches to close the incision. There are other forms of cataract surgery that require a larger incision and stitches to close the eye. This approach is taken in cases where the doctor feels that the cataract cannot be easily removed using phacoemulsification. AFTER THE PROCEDURE  When an IOL is implanted, it does not need care. It becomes a permanent part of your eye and cannot be seen or felt.  Your doctor will schedule follow-up exams to check on your progress.  Review your other medicines with your doctor to see which can be resumed after surgery.  Use eye drops or take medicine as prescribed by your doctor.   This information is not intended to replace advice given to you by your health care provider. Make sure you discuss any questions you have with your health care provider.   Document Released: 10/29/2011 Document Revised: 11/30/2014 Document Reviewed: 10/29/2011 Elsevier Interactive Patient Education [employer]

## 2016-02-26 ENCOUNTER — Encounter (HOSPITAL_COMMUNITY): Payer: Self-pay

## 2016-02-26 ENCOUNTER — Encounter (HOSPITAL_COMMUNITY)
Admission: RE | Admit: 2016-02-26 | Discharge: 2016-02-26 | Disposition: A | Discharge status: Home / Self Care | Payer: Medicare HMO | Source: Ambulatory Visit | Attending: Ophthalmology | Admitting: Ophthalmology

## 2016-02-26 ENCOUNTER — Other Ambulatory Visit: Payer: Self-pay

## 2016-02-26 DIAGNOSIS — H25812 Combined forms of age-related cataract, left eye: Secondary | ICD-10-CM | POA: Insufficient documentation

## 2016-02-26 DIAGNOSIS — H25811 Combined forms of age-related cataract, right eye: Secondary | ICD-10-CM | POA: Diagnosis not present

## 2016-02-26 HISTORY — DX: Cardiac arrhythmia, unspecified: I49.9

## 2016-02-26 LAB — CBC
HEMATOCRIT: 38.3 % (ref 36.0–46.0)
HEMOGLOBIN: 12.7 g/dL (ref 12.0–15.0)
MCH: 32.2 pg (ref 26.0–34.0)
MCHC: 33.2 g/dL (ref 30.0–36.0)
MCV: 97.2 fL (ref 78.0–100.0)
Platelets: 230 10*3/uL (ref 150–400)
RBC: 3.94 MIL/uL (ref 3.87–5.11)
RDW: 13.1 % (ref 11.5–15.5)
WBC: 4.4 10*3/uL (ref 4.0–10.5)

## 2016-02-26 LAB — BASIC METABOLIC PANEL
ANION GAP: 9 (ref 5–15)
BUN: 15 mg/dL (ref 6–20)
CO2: 28 mmol/L (ref 22–32)
Calcium: 9.1 mg/dL (ref 8.9–10.3)
Chloride: 104 mmol/L (ref 101–111)
Creatinine, Ser: 0.98 mg/dL (ref 0.44–1.00)
GFR calc Af Amer: >60 mL/min (ref >60)
GFR calc non Af Amer: 52 mL/min — ABNORMAL LOW (ref >60)
GLUCOSE: 92 mg/dL (ref 65–99)
POTASSIUM: 3.7 mmol/L (ref 3.5–5.1)
Sodium: 141 mmol/L (ref 135–145)

## 2016-03-02 ENCOUNTER — Ambulatory Visit (HOSPITAL_COMMUNITY)
Admission: RE | Admit: 2016-03-02 | Discharge: 2016-03-02 | Disposition: A | Discharge status: Home / Self Care | Payer: Medicare HMO | Source: Ambulatory Visit | Attending: Ophthalmology | Admitting: Ophthalmology

## 2016-03-02 ENCOUNTER — Encounter (HOSPITAL_COMMUNITY)
Admission: RE | Disposition: A | Discharge status: Home / Self Care | Payer: Self-pay | Source: Ambulatory Visit | Attending: Ophthalmology

## 2016-03-02 ENCOUNTER — Encounter (HOSPITAL_COMMUNITY): Payer: Self-pay | Admitting: *Deleted

## 2016-03-02 ENCOUNTER — Ambulatory Visit (HOSPITAL_COMMUNITY): Payer: Medicare HMO | Admitting: Anesthesiology

## 2016-03-02 DIAGNOSIS — R69 Illness, unspecified: Secondary | ICD-10-CM | POA: Diagnosis not present

## 2016-03-02 DIAGNOSIS — F419 Anxiety disorder, unspecified: Secondary | ICD-10-CM | POA: Diagnosis not present

## 2016-03-02 DIAGNOSIS — E7800 Pure hypercholesterolemia, unspecified: Secondary | ICD-10-CM | POA: Insufficient documentation

## 2016-03-02 DIAGNOSIS — I10 Essential (primary) hypertension: Secondary | ICD-10-CM | POA: Insufficient documentation

## 2016-03-02 DIAGNOSIS — Z79899 Other long term (current) drug therapy: Secondary | ICD-10-CM | POA: Insufficient documentation

## 2016-03-02 DIAGNOSIS — H25811 Combined forms of age-related cataract, right eye: Secondary | ICD-10-CM | POA: Insufficient documentation

## 2016-03-02 DIAGNOSIS — K219 Gastro-esophageal reflux disease without esophagitis: Secondary | ICD-10-CM | POA: Diagnosis not present

## 2016-03-02 DIAGNOSIS — H269 Unspecified cataract: Secondary | ICD-10-CM | POA: Diagnosis not present

## 2016-03-02 DIAGNOSIS — Z7982 Long term (current) use of aspirin: Secondary | ICD-10-CM | POA: Diagnosis not present

## 2016-03-02 HISTORY — PX: CATARACT EXTRACTION W/PHACO: SHX586

## 2016-03-02 SURGERY — PHACOEMULSIFICATION, CATARACT, WITH IOL INSERTION
Anesthesia: Monitor Anesthesia Care | Site: Eye | Laterality: Right

## 2016-03-02 MED ORDER — EPINEPHRINE HCL 1 MG/ML IJ SOLN
INTRAOCULAR | Status: DC | PRN
  Administered 2016-03-02: 500 mL

## 2016-03-02 MED ORDER — PHENYLEPHRINE HCL 2.5 % OP SOLN
1.0000 [drp] | OPHTHALMIC | Status: AC
  Administered 2016-03-02 (×3): 1 [drp] via OPHTHALMIC

## 2016-03-02 MED ORDER — EPINEPHRINE HCL 1 MG/ML IJ SOLN
INTRAMUSCULAR | Status: AC
  Filled 2016-03-02: qty 1

## 2016-03-02 MED ORDER — NEOMYCIN-POLYMYXIN-DEXAMETH 3.5-10000-0.1 OP SUSP
OPHTHALMIC | Status: DC | PRN
  Administered 2016-03-02: 2 [drp] via OPHTHALMIC

## 2016-03-02 MED ORDER — PROVISC 10 MG/ML IO SOLN
INTRAOCULAR | Status: DC | PRN
  Administered 2016-03-02: 0.85 mL via INTRAOCULAR

## 2016-03-02 MED ORDER — BSS IO SOLN
INTRAOCULAR | Status: DC | PRN
  Administered 2016-03-02: 15 mL

## 2016-03-02 MED ORDER — TETRACAINE HCL 0.5 % OP SOLN
1.0000 [drp] | OPHTHALMIC | Status: AC
  Administered 2016-03-02 (×3): 1 [drp] via OPHTHALMIC

## 2016-03-02 MED ORDER — CYCLOPENTOLATE-PHENYLEPHRINE 0.2-1 % OP SOLN
1.0000 [drp] | OPHTHALMIC | Status: AC
  Administered 2016-03-02 (×3): 1 [drp] via OPHTHALMIC

## 2016-03-02 MED ORDER — POVIDONE-IODINE 5 % OP SOLN
OPHTHALMIC | Status: DC | PRN
  Administered 2016-03-02: 1 via OPHTHALMIC

## 2016-03-02 MED ORDER — LIDOCAINE 3.5 % OP GEL OPTIME - NO CHARGE
OPHTHALMIC | Status: DC | PRN
  Administered 2016-03-02: 1 [drp] via OPHTHALMIC

## 2016-03-02 MED ORDER — LIDOCAINE HCL 3.5 % OP GEL
1.0000 "application " | Freq: Once | OPHTHALMIC | Status: AC
  Administered 2016-03-02: 1 via OPHTHALMIC

## 2016-03-02 MED ORDER — LACTATED RINGERS IV SOLN
INTRAVENOUS | Status: DC
  Administered 2016-03-02: 11:00:00 via INTRAVENOUS

## 2016-03-02 MED ORDER — MIDAZOLAM HCL 2 MG/2ML IJ SOLN
1.0000 mg | INTRAMUSCULAR | Status: DC | PRN
  Administered 2016-03-02: 2 mg via INTRAVENOUS
  Filled 2016-03-02: qty 2

## 2016-03-02 MED ORDER — LIDOCAINE HCL (PF) 1 % IJ SOLN
INTRAOCULAR | Status: DC | PRN
  Administered 2016-03-02: .7 mL

## 2016-03-02 MED ORDER — FENTANYL CITRATE (PF) 100 MCG/2ML IJ SOLN
25.0000 ug | Freq: Once | INTRAMUSCULAR | Status: AC
  Administered 2016-03-02: 25 ug via INTRAVENOUS
  Filled 2016-03-02: qty 2

## 2016-03-02 SURGICAL SUPPLY — 23 items
CAPSULAR TENSION RING-AMO (OPHTHALMIC RELATED) IMPLANT
CLOTH BEACON ORANGE TIMEOUT ST (SAFETY) ×2 IMPLANT
EYE SHIELD UNIVERSAL CLEAR (GAUZE/BANDAGES/DRESSINGS) ×2 IMPLANT
GLOVE BIOGEL PI IND STRL 7.0 (GLOVE) ×1 IMPLANT
GLOVE BIOGEL PI IND STRL 7.5 (GLOVE) IMPLANT
GLOVE BIOGEL PI INDICATOR 7.0 (GLOVE) ×1
GLOVE BIOGEL PI INDICATOR 7.5 (GLOVE)
GLOVE EXAM NITRILE LRG STRL (GLOVE) IMPLANT
GLOVE EXAM NITRILE MD LF STRL (GLOVE) ×2 IMPLANT
KIT VITRECTOMY (OPHTHALMIC RELATED) IMPLANT
PAD ARMBOARD 7.5X6 YLW CONV (MISCELLANEOUS) ×2 IMPLANT
PROC W NO LENS (INTRAOCULAR LENS)
PROC W SPEC LENS (INTRAOCULAR LENS)
PROCESS W NO LENS (INTRAOCULAR LENS) IMPLANT
PROCESS W SPEC LENS (INTRAOCULAR LENS) IMPLANT
RETRACTOR IRIS SIGHTPATH (OPHTHALMIC RELATED) IMPLANT
RING MALYGIN (MISCELLANEOUS) IMPLANT
SIGHTPATH CAT PROC W REG LENS (Ophthalmic Related) ×2 IMPLANT
SYRINGE LUER LOK 1CC (MISCELLANEOUS) ×2 IMPLANT
TAPE SURG TRANSPORE 1 IN (GAUZE/BANDAGES/DRESSINGS) ×1 IMPLANT
TAPE SURGICAL TRANSPORE 1 IN (GAUZE/BANDAGES/DRESSINGS) ×1
VISCOELASTIC ADDITIONAL (OPHTHALMIC RELATED) IMPLANT
WATER STERILE IRR 250ML POUR (IV SOLUTION) ×2 IMPLANT

## 2016-03-02 NOTE — Transfer of Care (Signed)
Immediate Anesthesia Transfer of Care Note  Patient: [name]  Procedure(s) Performed: Procedure(s) with comments: CATARACT EXTRACTION PHACO AND INTRAOCULAR LENS PLACEMENT (IOC) (Right) - CDE 10.57  Patient Location: Short Stay  Anesthesia Type:MAC  Level of Consciousness: awake, alert , oriented and patient cooperative  Airway & Oxygen Therapy: Patient Spontanous Breathing  Post-op Assessment: Report given to RN and Post -op Vital signs reviewed and stable  Post vital signs: Reviewed and stable  Last Vitals:  Filed Vitals:   03/02/16 1110 03/02/16 1115  BP: 134/61 133/58  Temp:    Resp: 16 14    Complications: No apparent anesthesia complications

## 2016-03-02 NOTE — Anesthesia Procedure Notes (Signed)
Procedure Name: MAC Date/Time: 03/02/2016 11:25 AM Performed by: Andree Elk, Aida Lemaire A Pre-anesthesia Checklist: Timeout performed, Patient identified, Emergency Drugs available, Suction available and Patient being monitored Oxygen Delivery Method: Nasal cannula

## 2016-03-02 NOTE — Anesthesia Preprocedure Evaluation (Signed)
Anesthesia Evaluation  Patient identified by MRN, date of birth, ID band Patient awake    Reviewed: Allergy & Precautions, NPO status , Patient's Chart, lab work & pertinent test results, reviewed documented beta blocker date and time   Airway Mallampati: II  TM Distance: >3 FB     Dental  (+) Teeth Intact   Pulmonary neg pulmonary ROS,    breath sounds clear to auscultation       Cardiovascular hypertension, Pt. on medications and Pt. on home beta blockers + dysrhythmias (palpitations -PVC's)  Rhythm:Regular Rate:Normal     Neuro/Psych    GI/Hepatic GERD  Medicated and Controlled,  Endo/Other    Renal/GU      Musculoskeletal   Abdominal   Peds  Hematology   Anesthesia Other Findings   Reproductive/Obstetrics                             Anesthesia Physical Anesthesia Plan  ASA: II  Anesthesia Plan: MAC   Post-op Pain Management:    Induction: Intravenous  Airway Management Planned: Nasal Cannula  Additional Equipment:   Intra-op Plan:   Post-operative Plan:   Informed Consent: I have reviewed the patients History and Physical, chart, labs and discussed the procedure including the risks, benefits and alternatives for the proposed anesthesia with the patient or authorized representative who has indicated his/her understanding and acceptance.     Plan Discussed with:   Anesthesia Plan Comments:         Anesthesia Quick Evaluation

## 2016-03-02 NOTE — Anesthesia Postprocedure Evaluation (Signed)
Anesthesia Post Note  Patient: [name]  Procedure(s) Performed: Procedure(s) (LRB): CATARACT EXTRACTION PHACO AND INTRAOCULAR LENS PLACEMENT (IOC) (Right)  Patient location during evaluation: Short Stay Anesthesia Type: MAC Level of consciousness: awake and alert and oriented Pain management: pain level controlled Vital Signs Assessment: post-procedure vital signs reviewed and stable Respiratory status: respiratory function stable Cardiovascular status: stable Postop Assessment: no signs of nausea or vomiting Anesthetic complications: no    Last Vitals:  Filed Vitals:   03/02/16 1110 03/02/16 1115  BP: 134/61 133/58  Temp:    Resp: 16 14    Last Pain:  Filed Vitals:   03/02/16 1119  PainSc: 1                  ADAMS, AMY A

## 2016-03-02 NOTE — Discharge Instructions (Signed)
Anesthesia, Adult, Care After °Refer to this sheet in the next few weeks. These instructions provide you with information on caring for yourself after your procedure. Your health care provider may also give you more specific instructions. Your treatment has been planned according to current medical practices, but problems sometimes occur. Call your health care provider if you have any problems or questions after your procedure. °WHAT TO EXPECT AFTER THE PROCEDURE °After the procedure, it is typical to experience: °· Sleepiness. °· Nausea and vomiting. °HOME CARE INSTRUCTIONS °· For the first 24 hours after general anesthesia: °¨ Have a responsible person with you. °¨ Do not drive a car. If you are alone, do not take public transportation. °¨ Do not drink alcohol. °¨ Do not take medicine that has not been prescribed by your health care provider. °¨ Do not sign important papers or make important decisions. °¨ You may resume a normal diet and activities as directed by your health care provider. °· Change bandages (dressings) as directed. °· If you have questions or problems that seem related to general anesthesia, call the hospital and ask for the anesthetist or anesthesiologist on call. °SEEK MEDICAL CARE IF: °· You have nausea and vomiting that continue the day after anesthesia. °· You develop a rash. °SEEK IMMEDIATE MEDICAL CARE IF:  °· You have difficulty breathing. °· You have chest pain. °· You have any allergic problems. °  °This information is not intended to replace advice given to you by your health care provider. Make sure you discuss any questions you have with your health care provider. °  °Document Released: 02/15/2001 Document Revised: 11/30/2014 Document Reviewed: 03/09/2012 °Elsevier Interactive Patient Education ©2016 [employer] Inc. ° °

## 2016-03-02 NOTE — H&P (Signed)
I have reviewed the H&P, the patient was re-examined, and I have identified no interval changes in medical condition and plan of care since the history and physical of record  

## 2016-03-02 NOTE — Op Note (Signed)
Date of Admission: 03/02/2016  Date of Surgery: 03/02/2016   Pre-Op Dx: Cataract Right Eye  Post-Op Dx: Senile Combined Cataract Right  Eye,  Dx Code [id number]  Surgeon: Tonny Branch, M.D.  Assistants: None  Anesthesia: Topical with MAC  Indications: Painless, progressive loss of vision with compromise of daily activities.  Surgery: Cataract Extraction with Intraocular lens Implant Right Eye  Discription: The patient had dilating drops and viscous lidocaine placed into the Right eye in the pre-op holding area. After transfer to the operating room, a time out was performed. The patient was then prepped and draped. Beginning with a 1 degree blade a paracentesis port was made at the surgeon's 2 o'clock position. The anterior chamber was then filled with 1% non-preserved lidocaine. This was followed by filling the anterior chamber with Provisc.  A 2.8mm keratome blade was used to make a clear corneal incision at the temporal limbus.  A bent cystatome needle was used to create a continuous tear capsulotomy. Hydrodissection was performed with balanced salt solution on a Fine canula. The lens nucleus was then removed using the phacoemulsification handpiece. Residual cortex was removed with the I&A handpiece. The anterior chamber and capsular bag were refilled with Provisc. A posterior chamber intraocular lens was placed into the capsular bag with it's injector. The implant was positioned with the Kuglan hook. The Provisc was then removed from the anterior chamber and capsular bag with the I&A handpiece. Stromal hydration of the main incision and paracentesis port was performed with BSS on a Fine canula. The wounds were tested for leak which was negative. The patient tolerated the procedure well. There were no operative complications. The patient was then transferred to the recovery room in stable condition.  Complications: None  Specimen: None  EBL: None  Prosthetic device: Hoya iSert 250, power 14.5  D, SN [id number].

## 2016-03-03 ENCOUNTER — Encounter (HOSPITAL_COMMUNITY): Payer: Self-pay | Admitting: Ophthalmology

## 2016-03-23 DIAGNOSIS — H52223 Regular astigmatism, bilateral: Secondary | ICD-10-CM | POA: Diagnosis not present

## 2016-05-28 DIAGNOSIS — H00015 Hordeolum externum left lower eyelid: Secondary | ICD-10-CM | POA: Diagnosis not present

## 2016-06-08 DIAGNOSIS — K588 Other irritable bowel syndrome: Secondary | ICD-10-CM | POA: Diagnosis not present

## 2016-06-08 DIAGNOSIS — K219 Gastro-esophageal reflux disease without esophagitis: Secondary | ICD-10-CM | POA: Diagnosis not present

## 2016-07-09 DIAGNOSIS — I10 Essential (primary) hypertension: Secondary | ICD-10-CM | POA: Diagnosis not present

## 2016-07-09 DIAGNOSIS — K219 Gastro-esophageal reflux disease without esophagitis: Secondary | ICD-10-CM | POA: Diagnosis not present

## 2016-07-09 DIAGNOSIS — E7800 Pure hypercholesterolemia, unspecified: Secondary | ICD-10-CM | POA: Diagnosis not present

## 2016-07-09 DIAGNOSIS — Z0000 Encounter for general adult medical examination without abnormal findings: Secondary | ICD-10-CM | POA: Diagnosis not present

## 2016-08-06 DIAGNOSIS — Z23 Encounter for immunization: Secondary | ICD-10-CM | POA: Diagnosis not present

## 2016-10-12 DIAGNOSIS — Z8719 Personal history of other diseases of the digestive system: Secondary | ICD-10-CM | POA: Diagnosis not present

## 2016-10-12 DIAGNOSIS — R002 Palpitations: Secondary | ICD-10-CM | POA: Diagnosis not present

## 2017-02-08 DIAGNOSIS — Z79899 Other long term (current) drug therapy: Secondary | ICD-10-CM | POA: Diagnosis not present

## 2017-02-08 DIAGNOSIS — I10 Essential (primary) hypertension: Secondary | ICD-10-CM | POA: Diagnosis not present

## 2017-02-08 DIAGNOSIS — E785 Hyperlipidemia, unspecified: Secondary | ICD-10-CM | POA: Diagnosis not present

## 2017-02-08 DIAGNOSIS — K219 Gastro-esophageal reflux disease without esophagitis: Secondary | ICD-10-CM | POA: Diagnosis not present

## 2017-02-08 DIAGNOSIS — R69 Illness, unspecified: Secondary | ICD-10-CM | POA: Diagnosis not present

## 2017-02-16 DIAGNOSIS — Z6830 Body mass index (BMI) 30.0-30.9, adult: Secondary | ICD-10-CM | POA: Diagnosis not present

## 2017-02-16 DIAGNOSIS — E785 Hyperlipidemia, unspecified: Secondary | ICD-10-CM | POA: Diagnosis not present

## 2017-02-16 DIAGNOSIS — Z0001 Encounter for general adult medical examination with abnormal findings: Secondary | ICD-10-CM | POA: Diagnosis not present

## 2017-02-16 DIAGNOSIS — I10 Essential (primary) hypertension: Secondary | ICD-10-CM | POA: Diagnosis not present

## 2017-03-09 DIAGNOSIS — J069 Acute upper respiratory infection, unspecified: Secondary | ICD-10-CM | POA: Diagnosis not present

## 2017-05-14 DIAGNOSIS — H5200 Hypermetropia, unspecified eye: Secondary | ICD-10-CM | POA: Diagnosis not present

## 2017-06-17 DIAGNOSIS — R002 Palpitations: Secondary | ICD-10-CM | POA: Diagnosis not present

## 2017-06-17 DIAGNOSIS — I10 Essential (primary) hypertension: Secondary | ICD-10-CM | POA: Diagnosis not present

## 2017-10-11 DIAGNOSIS — I10 Essential (primary) hypertension: Secondary | ICD-10-CM | POA: Diagnosis not present

## 2017-10-11 DIAGNOSIS — R51 Headache: Secondary | ICD-10-CM | POA: Diagnosis not present

## 2017-10-25 DIAGNOSIS — L570 Actinic keratosis: Secondary | ICD-10-CM | POA: Diagnosis not present

## 2017-10-25 DIAGNOSIS — L821 Other seborrheic keratosis: Secondary | ICD-10-CM | POA: Diagnosis not present

## 2017-10-25 DIAGNOSIS — L814 Other melanin hyperpigmentation: Secondary | ICD-10-CM | POA: Diagnosis not present

## 2017-11-08 DIAGNOSIS — R51 Headache: Secondary | ICD-10-CM | POA: Diagnosis not present

## 2017-11-08 DIAGNOSIS — I10 Essential (primary) hypertension: Secondary | ICD-10-CM | POA: Diagnosis not present

## 2018-01-05 DIAGNOSIS — H16223 Keratoconjunctivitis sicca, not specified as Sjogren's, bilateral: Secondary | ICD-10-CM | POA: Diagnosis not present

## 2018-01-05 DIAGNOSIS — H01009 Unspecified blepharitis unspecified eye, unspecified eyelid: Secondary | ICD-10-CM | POA: Diagnosis not present

## 2018-03-07 DIAGNOSIS — H01009 Unspecified blepharitis unspecified eye, unspecified eyelid: Secondary | ICD-10-CM | POA: Diagnosis not present

## 2018-03-07 DIAGNOSIS — Z961 Presence of intraocular lens: Secondary | ICD-10-CM | POA: Diagnosis not present

## 2018-03-07 DIAGNOSIS — H16223 Keratoconjunctivitis sicca, not specified as Sjogren's, bilateral: Secondary | ICD-10-CM | POA: Diagnosis not present

## 2018-03-08 DIAGNOSIS — I10 Essential (primary) hypertension: Secondary | ICD-10-CM | POA: Diagnosis not present

## 2018-03-08 DIAGNOSIS — R69 Illness, unspecified: Secondary | ICD-10-CM | POA: Diagnosis not present

## 2018-03-08 DIAGNOSIS — G4700 Insomnia, unspecified: Secondary | ICD-10-CM | POA: Diagnosis not present

## 2018-03-08 DIAGNOSIS — N183 Chronic kidney disease, stage 3 (moderate): Secondary | ICD-10-CM | POA: Diagnosis not present

## 2018-03-08 DIAGNOSIS — R002 Palpitations: Secondary | ICD-10-CM | POA: Diagnosis not present

## 2018-03-08 DIAGNOSIS — K219 Gastro-esophageal reflux disease without esophagitis: Secondary | ICD-10-CM | POA: Diagnosis not present

## 2018-03-08 DIAGNOSIS — Z79899 Other long term (current) drug therapy: Secondary | ICD-10-CM | POA: Diagnosis not present

## 2018-03-15 DIAGNOSIS — I493 Ventricular premature depolarization: Secondary | ICD-10-CM | POA: Diagnosis not present

## 2018-03-15 DIAGNOSIS — I10 Essential (primary) hypertension: Secondary | ICD-10-CM | POA: Diagnosis not present

## 2018-03-15 DIAGNOSIS — E785 Hyperlipidemia, unspecified: Secondary | ICD-10-CM | POA: Diagnosis not present

## 2018-03-15 DIAGNOSIS — Z0001 Encounter for general adult medical examination with abnormal findings: Secondary | ICD-10-CM | POA: Diagnosis not present

## 2018-03-15 DIAGNOSIS — R7301 Impaired fasting glucose: Secondary | ICD-10-CM | POA: Diagnosis not present

## 2018-07-05 DIAGNOSIS — R002 Palpitations: Secondary | ICD-10-CM | POA: Diagnosis not present

## 2018-07-05 DIAGNOSIS — I10 Essential (primary) hypertension: Secondary | ICD-10-CM | POA: Diagnosis not present

## 2018-07-15 ENCOUNTER — Encounter (HOSPITAL_COMMUNITY): Payer: Self-pay | Admitting: Emergency Medicine

## 2018-07-15 ENCOUNTER — Emergency Department (HOSPITAL_COMMUNITY): Payer: Medicare HMO

## 2018-07-15 ENCOUNTER — Emergency Department (HOSPITAL_COMMUNITY)
Admission: EM | Admit: 2018-07-15 | Discharge: 2018-07-15 | Disposition: A | Discharge status: Home / Self Care | Payer: Medicare HMO | Attending: Emergency Medicine | Admitting: Emergency Medicine

## 2018-07-15 ENCOUNTER — Other Ambulatory Visit: Payer: Self-pay

## 2018-07-15 DIAGNOSIS — S79911A Unspecified injury of right hip, initial encounter: Secondary | ICD-10-CM | POA: Diagnosis not present

## 2018-07-15 DIAGNOSIS — Z79899 Other long term (current) drug therapy: Secondary | ICD-10-CM | POA: Insufficient documentation

## 2018-07-15 DIAGNOSIS — Z7982 Long term (current) use of aspirin: Secondary | ICD-10-CM | POA: Insufficient documentation

## 2018-07-15 DIAGNOSIS — M25551 Pain in right hip: Secondary | ICD-10-CM | POA: Insufficient documentation

## 2018-07-15 DIAGNOSIS — I10 Essential (primary) hypertension: Secondary | ICD-10-CM | POA: Insufficient documentation

## 2018-07-15 DIAGNOSIS — E785 Hyperlipidemia, unspecified: Secondary | ICD-10-CM | POA: Insufficient documentation

## 2018-07-15 MED ORDER — MELOXICAM 7.5 MG PO TABS: 7.5000 mg | ORAL_TABLET | Freq: Every day | ORAL | 0 refills | Status: DC

## 2018-07-15 NOTE — Discharge Instructions (Signed)
Return if any problems.  See your Physician for recheck in 3-4 days  ?

## 2018-07-15 NOTE — ED Provider Notes (Signed)
[facility] EMERGENCY DEPARTMENT Provider Note   CSN: [id number] Arrival date & time: 07/15/18  1338     History   Chief Complaint Chief Complaint  Patient presents with  . Hip Pain    HPI [name] is a 84 y.o. female.  The history is provided by the patient.  Hip Pain  This is a new problem. The current episode started yesterday. The problem occurs constantly. The problem has been gradually worsening. Nothing aggravates the symptoms. Nothing relieves the symptoms. She has tried nothing for the symptoms. The treatment provided no relief.  Pt reports she had a tearing sensation in her hip yesterday.  Pt reports similar two weeks ago   Past Medical History:  Diagnosis Date  . Anxiety   . Dysrhythmia   . Essential hypertension   . GERD (gastroesophageal reflux disease)   . Hyperlipidemia   . Palpitations    Reportedly symptomatic PVCs   . Spinal stenosis   . Vaginal atrophy 04/18/2015  . Vaginal itching 04/18/2015    Patient Active Problem List   Diagnosis Date Noted  . Vaginal itching 04/18/2015  . Vaginal atrophy 04/18/2015  . Essential hypertension 10/23/2014  . Palpitations 10/23/2014  . Symptomatic PVCs 10/23/2014  . GERD 06/22/2008    Past Surgical History:  Procedure Laterality Date  . Bilateral breast cyst  1964  . CATARACT EXTRACTION W/PHACO Right 03/02/2016   Procedure: CATARACT EXTRACTION PHACO AND INTRAOCULAR LENS PLACEMENT (IOC);  Surgeon: Tonny Branch, MD;  Location: [facility];  Service: Ophthalmology;  Laterality: Right;  CDE 10.57  . [place] [facility]  2010  . CHOLECYSTECTOMY     04/07/2011  . COLONOSCOPY     11/04/2010 Dr.Rourk  . [place] OF UTERUS  1972  . UPPER GASTROINTESTINAL ENDOSCOPY       OB History    Gravida  1   Para  1   Term      Preterm      AB      Living  1     SAB      TAB      Ectopic      Multiple      Live Births               Home Medications    Prior to Admission medications    Medication Sig Start Date End Date Taking? Authorizing Provider  acetaminophen (TYLENOL) 500 MG tablet Take 1,000 mg by mouth as needed for mild pain or fever.     [provider]  aspirin 81 MG tablet Take 81 mg by mouth daily.      [provider]  atorvastatin (LIPITOR) 10 MG tablet Take 5 mg by mouth daily.     [provider]  Calcium Carbonate Antacid (TUMS PO) Take 1 tablet by mouth as needed (heartburn).     [provider]  esomeprazole (NEXIUM) 40 MG capsule Take 40 mg by mouth daily before breakfast.    [provider]  HYDROcodone-acetaminophen (NORCO/VICODIN) 5-325 MG per tablet Take 1 tablet by mouth daily as needed (back pain).  01/28/15   [provider]  KLOR-CON M20 20 MEQ tablet Take 20 mEq by mouth 2 (two) times daily.  03/20/13   [provider]  LORazepam (ATIVAN) 2 MG tablet Take 2 mg by mouth at bedtime.     [provider]  meloxicam (MOBIC) 7.5 MG tablet Take 1 tablet (7.5 mg total) by mouth daily. 07/15/18  Caryl Ada K, PA-C  metoprolol (TOPROL-XL) 200 MG 24 hr tablet Take 200 mg by mouth daily.    [provider]  Polyethyl Glycol-Propyl Glycol (SYSTANE ULTRA OP) Apply 1 drop to eye 3 (three) times daily.    [provider]  triamterene-hydrochlorothiazide (MAXZIDE-25) 37.5-25 MG tablet Take 1 tablet by mouth daily. 01/30/16   [provider]  verapamil (CALAN-SR) 120 MG CR tablet Take 120 mg by mouth daily.  03/05/15   [provider]    Family History Family History  Problem Relation Age of Onset  . Healthy Son   . Heart disease Father   . Dementia Mother   . Alzheimer's disease Mother   . Tuberculosis Maternal Grandmother   . Diabetes Maternal Grandmother   . Alzheimer's disease Maternal Grandfather   . Hypertension Paternal Grandfather   . Stroke Paternal Grandfather     Social History Social History   Tobacco Use  . Smoking status: Never Smoker   . Smokeless tobacco: Never Used  Substance Use Topics  . Alcohol use: Yes    Comment: Social  . Drug use: No     Allergies   Tricyclic antidepressants   Review of Systems Review of Systems  All other systems reviewed and are negative.    Physical Exam Updated Vital Signs BP 133/60 (BP Location: Right Arm)   Pulse (!) 58   Temp (!) 97.5 F (36.4 C) (Oral)   Resp 16   Ht 5' (1.524 m)   Wt 61.2 kg   SpO2 98%   BMI 26.37 kg/m   Physical Exam  Constitutional: She appears well-developed and well-nourished.  Cardiovascular: Normal rate.  Pulmonary/Chest: Effort normal.  Abdominal: Soft.  Musculoskeletal: She exhibits tenderness.  Tender right hip,  Pain with movement. nv and ns intact   Neurological: She is alert.  Skin: Skin is warm.  Psychiatric: She has a normal mood and affect.  Nursing note and vitals reviewed.    ED Treatments / Results  Labs (all labs ordered are listed, but only abnormal results are displayed) Labs Reviewed - No data to display  EKG None  Radiology Dg Hip Unilat  With Pelvis 2-3 Views Right  Result Date: 07/15/2018 CLINICAL DATA:  Right hip twisting injury with a pop and onset of pain yesterday. Initial encounter. EXAM: DG HIP (WITH OR WITHOUT PELVIS) 2-3V RIGHT COMPARISON:  None. FINDINGS: There is no evidence of hip fracture or dislocation. There is no evidence of arthropathy or other focal bone abnormality. IMPRESSION: Negative exam. Electronically Signed   By: Inge Rise M.D.   On: 07/15/2018 14:28    Procedures Procedures (including critical care time)  Medications Ordered in ED Medications - No data to display   Initial Impression / Assessment and Plan / ED Course  I have reviewed the triage vital signs and the nursing notes.  Pertinent labs & imaging results that were available during my care of the patient were reviewed by me and considered in my medical decision making (see chart for details).  Clinical Course  as of Jul 15 1532  Fri Jul 15, 2018  1515 DG Hip Unilat  With Pelvis 2-3 Views Right [LS]    Clinical Course User Index [LS] Fransico Meadow, [place]      Final Clinical Impressions(s) / ED Diagnoses   Final diagnoses:  Right hip pain    ED Discharge Orders         Ordered    meloxicam (MOBIC) 7.5 MG tablet  Daily     07/15/18 1528        An After Visit Summary was printed and given to the patient.    Sidney Ace 07/15/18 1535    Noemi Chapel, MD 07/16/18 [phone]

## 2018-07-15 NOTE — ED Triage Notes (Signed)
Patient complaining of right hip pain after twisting her hip last night. Denies fall.

## 2018-09-09 DIAGNOSIS — Z23 Encounter for immunization: Secondary | ICD-10-CM | POA: Diagnosis not present

## 2018-10-22 IMAGING — DX DG HIP (WITH OR WITHOUT PELVIS) 2-3V*R*
3 series · 3 of 3 positions shown · non-contrast
Comparison: None.

CLINICAL DATA: Right hip twisting injury with a pop and onset of
pain yesterday. Initial encounter.

EXAM:
DG HIP (WITH OR WITHOUT PELVIS) 2-3V RIGHT

[pelvis ap]
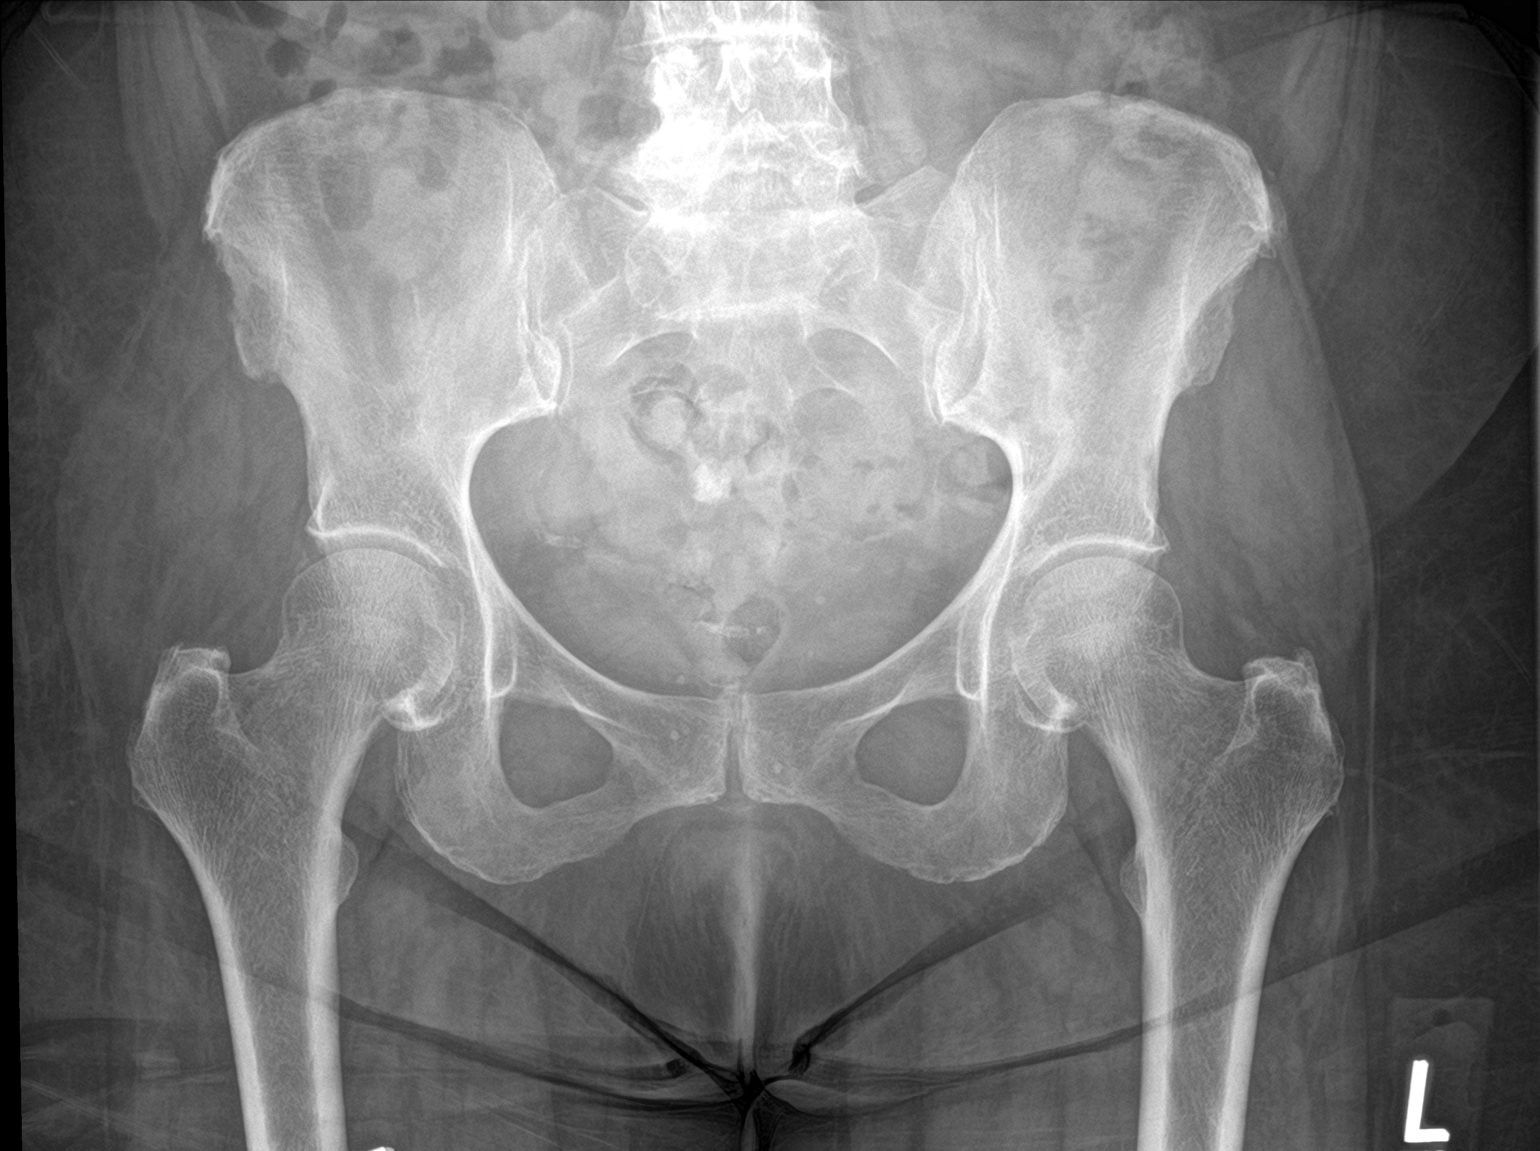

[hip ap]
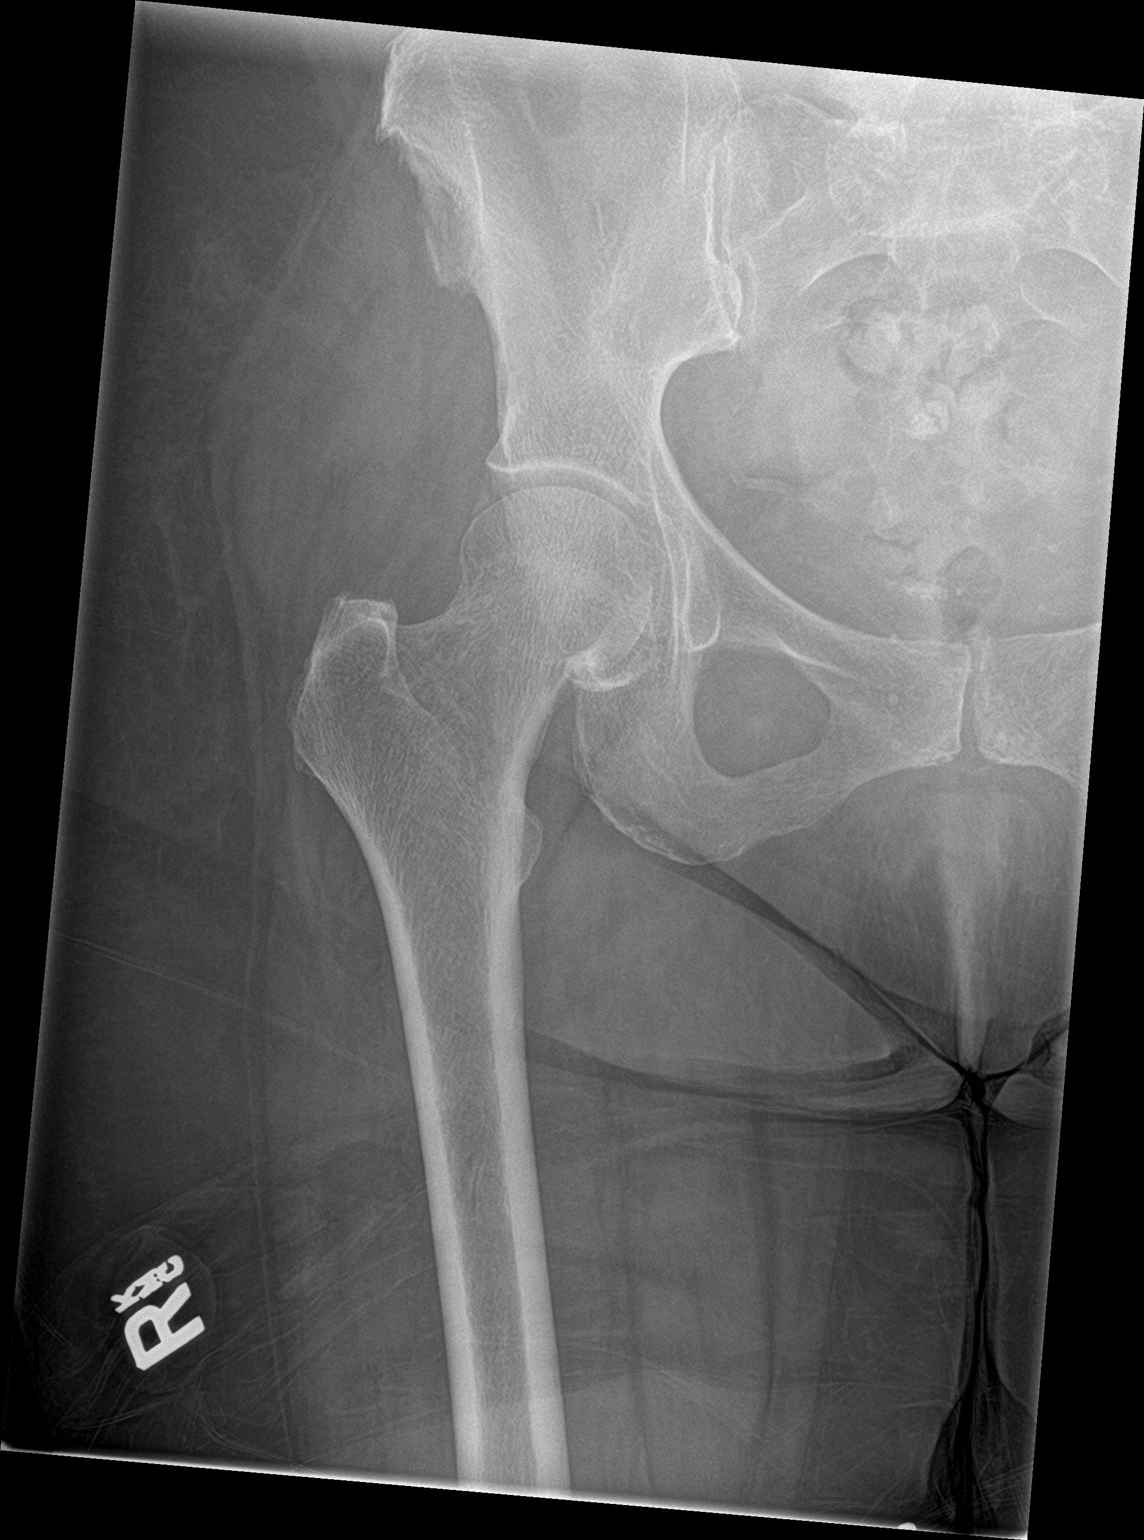

[hip lat]
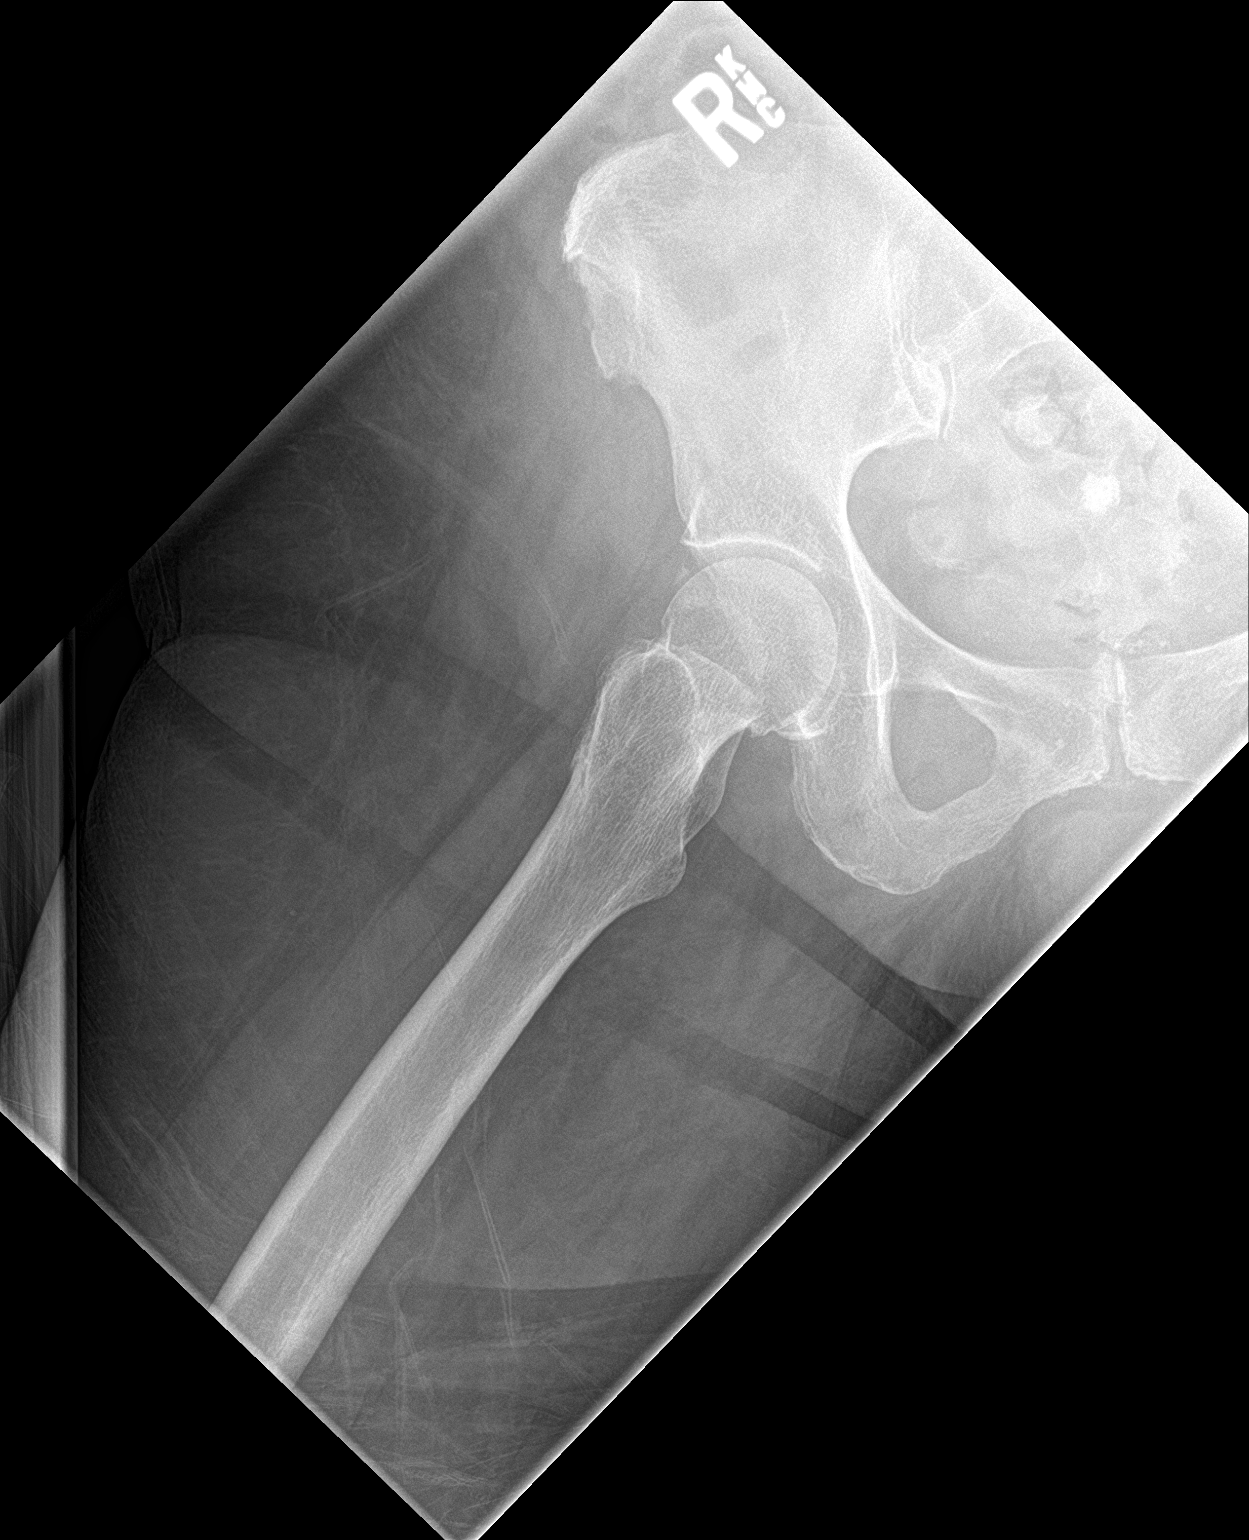

[3 of 3 positions shown; findings below may reference images not displayed]

FINDINGS: There is no evidence of hip fracture or dislocation. There is no
evidence of arthropathy or other focal bone abnormality.
IMPRESSION: Negative exam.

## 2018-11-08 DIAGNOSIS — M25551 Pain in right hip: Secondary | ICD-10-CM | POA: Diagnosis not present

## 2018-11-08 DIAGNOSIS — I10 Essential (primary) hypertension: Secondary | ICD-10-CM | POA: Diagnosis not present

## 2018-12-23 DIAGNOSIS — H5203 Hypermetropia, bilateral: Secondary | ICD-10-CM | POA: Diagnosis not present

## 2018-12-23 DIAGNOSIS — Z961 Presence of intraocular lens: Secondary | ICD-10-CM | POA: Diagnosis not present

## 2018-12-23 DIAGNOSIS — H26491 Other secondary cataract, right eye: Secondary | ICD-10-CM | POA: Diagnosis not present

## 2018-12-23 DIAGNOSIS — H26492 Other secondary cataract, left eye: Secondary | ICD-10-CM | POA: Diagnosis not present

## 2019-01-23 DIAGNOSIS — H26493 Other secondary cataract, bilateral: Secondary | ICD-10-CM | POA: Diagnosis not present

## 2019-02-07 DIAGNOSIS — H5203 Hypermetropia, bilateral: Secondary | ICD-10-CM | POA: Diagnosis not present

## 2019-02-07 DIAGNOSIS — H26491 Other secondary cataract, right eye: Secondary | ICD-10-CM | POA: Diagnosis not present

## 2019-02-07 DIAGNOSIS — H26492 Other secondary cataract, left eye: Secondary | ICD-10-CM | POA: Diagnosis not present

## 2019-02-07 DIAGNOSIS — Z961 Presence of intraocular lens: Secondary | ICD-10-CM | POA: Diagnosis not present

## 2019-03-28 DIAGNOSIS — R002 Palpitations: Secondary | ICD-10-CM | POA: Diagnosis not present

## 2019-03-28 DIAGNOSIS — K219 Gastro-esophageal reflux disease without esophagitis: Secondary | ICD-10-CM | POA: Diagnosis not present

## 2019-03-28 DIAGNOSIS — Z79899 Other long term (current) drug therapy: Secondary | ICD-10-CM | POA: Diagnosis not present

## 2019-03-28 DIAGNOSIS — N181 Chronic kidney disease, stage 1: Secondary | ICD-10-CM | POA: Diagnosis not present

## 2019-03-28 DIAGNOSIS — G4700 Insomnia, unspecified: Secondary | ICD-10-CM | POA: Diagnosis not present

## 2019-03-28 DIAGNOSIS — I10 Essential (primary) hypertension: Secondary | ICD-10-CM | POA: Diagnosis not present

## 2019-03-28 DIAGNOSIS — R7301 Impaired fasting glucose: Secondary | ICD-10-CM | POA: Diagnosis not present

## 2019-03-28 DIAGNOSIS — E785 Hyperlipidemia, unspecified: Secondary | ICD-10-CM | POA: Diagnosis not present

## 2019-03-28 DIAGNOSIS — R69 Illness, unspecified: Secondary | ICD-10-CM | POA: Diagnosis not present

## 2019-03-29 DIAGNOSIS — I10 Essential (primary) hypertension: Secondary | ICD-10-CM | POA: Diagnosis not present

## 2019-03-29 DIAGNOSIS — K219 Gastro-esophageal reflux disease without esophagitis: Secondary | ICD-10-CM | POA: Diagnosis not present

## 2019-03-29 DIAGNOSIS — E785 Hyperlipidemia, unspecified: Secondary | ICD-10-CM | POA: Diagnosis not present

## 2019-03-29 DIAGNOSIS — R002 Palpitations: Secondary | ICD-10-CM | POA: Diagnosis not present

## 2019-04-06 ENCOUNTER — Telehealth: Payer: Self-pay | Admitting: Adult Health

## 2019-04-06 MED ORDER — NYSTATIN-TRIAMCINOLONE 100000-0.1 UNIT/GM-% EX OINT: 1.0000 "application " | TOPICAL_OINTMENT | Freq: Two times a day (BID) | CUTANEOUS | 0 refills | Status: DC

## 2019-04-06 NOTE — Telephone Encounter (Signed)
Would like to talk  to [facility] about some vaginal itching. She said she has had before.

## 2019-04-06 NOTE — Addendum Note (Signed)
Addended by: Derrek Monaco A on: 04/06/2019 05:12 PM   Modules accepted: Orders

## 2019-04-06 NOTE — Telephone Encounter (Signed)
Pt has itching near clitoris, will rx mytrex ointment and if not better, make appt next week

## 2019-06-01 DIAGNOSIS — I10 Essential (primary) hypertension: Secondary | ICD-10-CM | POA: Diagnosis not present

## 2019-06-20 DIAGNOSIS — R002 Palpitations: Secondary | ICD-10-CM | POA: Diagnosis not present

## 2019-06-20 DIAGNOSIS — E785 Hyperlipidemia, unspecified: Secondary | ICD-10-CM | POA: Diagnosis not present

## 2019-06-20 DIAGNOSIS — I10 Essential (primary) hypertension: Secondary | ICD-10-CM | POA: Diagnosis not present

## 2019-06-20 DIAGNOSIS — R7301 Impaired fasting glucose: Secondary | ICD-10-CM | POA: Diagnosis not present

## 2019-06-20 DIAGNOSIS — K219 Gastro-esophageal reflux disease without esophagitis: Secondary | ICD-10-CM | POA: Diagnosis not present

## 2019-06-20 DIAGNOSIS — Z79899 Other long term (current) drug therapy: Secondary | ICD-10-CM | POA: Diagnosis not present

## 2019-06-23 DIAGNOSIS — I10 Essential (primary) hypertension: Secondary | ICD-10-CM | POA: Diagnosis not present

## 2019-07-06 DIAGNOSIS — I10 Essential (primary) hypertension: Secondary | ICD-10-CM | POA: Diagnosis not present

## 2019-07-06 DIAGNOSIS — R002 Palpitations: Secondary | ICD-10-CM | POA: Diagnosis not present

## 2019-08-07 DIAGNOSIS — R002 Palpitations: Secondary | ICD-10-CM | POA: Diagnosis not present

## 2019-08-07 DIAGNOSIS — I10 Essential (primary) hypertension: Secondary | ICD-10-CM | POA: Diagnosis not present

## 2019-08-21 DIAGNOSIS — Z23 Encounter for immunization: Secondary | ICD-10-CM | POA: Diagnosis not present

## 2019-09-18 DIAGNOSIS — M4316 Spondylolisthesis, lumbar region: Secondary | ICD-10-CM | POA: Diagnosis not present

## 2019-09-18 DIAGNOSIS — I10 Essential (primary) hypertension: Secondary | ICD-10-CM | POA: Diagnosis not present

## 2019-09-18 DIAGNOSIS — M545 Low back pain: Secondary | ICD-10-CM | POA: Diagnosis not present

## 2019-09-18 DIAGNOSIS — Z6825 Body mass index (BMI) 25.0-25.9, adult: Secondary | ICD-10-CM | POA: Diagnosis not present

## 2019-10-09 DIAGNOSIS — R002 Palpitations: Secondary | ICD-10-CM | POA: Diagnosis not present

## 2019-10-09 DIAGNOSIS — I10 Essential (primary) hypertension: Secondary | ICD-10-CM | POA: Diagnosis not present

## 2019-10-10 DIAGNOSIS — M4316 Spondylolisthesis, lumbar region: Secondary | ICD-10-CM | POA: Diagnosis not present

## 2019-11-02 ENCOUNTER — Other Ambulatory Visit: Payer: Self-pay

## 2019-11-02 DIAGNOSIS — Z20822 Contact with and (suspected) exposure to covid-19: Secondary | ICD-10-CM

## 2019-11-03 LAB — NOVEL CORONAVIRUS, NAA: SARS-CoV-2, NAA: NOT DETECTED

## 2019-12-15 ENCOUNTER — Ambulatory Visit: Payer: Medicare HMO | Attending: Internal Medicine

## 2019-12-15 DIAGNOSIS — Z23 Encounter for immunization: Secondary | ICD-10-CM

## 2019-12-15 NOTE — Progress Notes (Signed)
   Covid-19 Vaccination Clinic  Name:  [name]    MRN: [id number] DOB: 1931-01-31  12/15/2019  [name] was observed post Covid-19 immunization for 15 minutes without incidence. She was provided with Vaccine Information Sheet and instruction to access the V-Safe system.   [name] was instructed to call 911 with any severe reactions post vaccine: Marland Kitchen Difficulty breathing  . Swelling of your face and throat  . A fast heartbeat  . A bad rash all over your body  . Dizziness and weakness    Immunizations Administered    Name Date Dose VIS Date Route   Pfizer COVID-19 Vaccine 12/15/2019 12:36 PM 0.3 mL 11/03/2019 Intramuscular   Manufacturer: [place]   Lot: [id number]   [place]: [id number]

## 2020-01-02 ENCOUNTER — Ambulatory Visit: Payer: Medicare HMO | Attending: Internal Medicine

## 2020-01-02 DIAGNOSIS — Z23 Encounter for immunization: Secondary | ICD-10-CM | POA: Insufficient documentation

## 2020-01-02 NOTE — Progress Notes (Signed)
   Covid-19 Vaccination Clinic  Name:  [name]    MRN: [id number] DOB: 1931/02/20  01/02/2020  [name] was observed post Covid-19 immunization for 15 minutes without incidence. She was provided with Vaccine Information Sheet and instruction to access the V-Safe system.   [name] was instructed to call 911 with any severe reactions post vaccine: Marland Kitchen Difficulty breathing  . Swelling of your face and throat  . A fast heartbeat  . A bad rash all over your body  . Dizziness and weakness    Immunizations Administered    Name Date Dose VIS Date Route   Pfizer COVID-19 Vaccine 01/02/2020 10:46 AM 0.3 mL 11/03/2019 Intramuscular   Manufacturer: [place]   Lot: [id number]   [place]: [id number]

## 2020-03-11 ENCOUNTER — Encounter: Payer: Self-pay | Admitting: Cardiology

## 2020-03-11 NOTE — Progress Notes (Signed)
Cardiology Office Note  Date: 03/12/2020   ID: Cornetta, Keister 24-Sep-1931, MRN [id number]  PCP:  Asencion Noble, MD  Cardiologist:  Rozann Lesches, MD Electrophysiologist:  None   Chief Complaint  Patient presents with  . Chest discomfort    History of Present Illness: [name] is an 86 y.o. female referred for cardiology consultation by Dr. Willey Blade for evaluation of chest pain.  She states that her husband passed away back in December 20, 2022, she has been under a lot of stress and going through adjustments related to this.  Since that time she has been experiencing intermittent tightness in her chest, usually in the afternoons, also decreased stamina in general.  She has joined a senior exercise group at church, would like to be able to walk further.  She reports compliance with her medications as outlined below.  She did have some trouble tolerating clonidine, most recent antihypertensive is hydralazine.  She has also been on aspirin and Lipitor for cardiac risk reduction.  I personally reviewed her ECG today which shows sinus bradycardia with lead motion artifact, nonspecific R' in lead V1.  Past Medical History:  Diagnosis Date  . Anxiety   . Essential hypertension   . GERD (gastroesophageal reflux disease)   . Hyperlipidemia   . Palpitations    Reportedly symptomatic PVCs   . Spinal stenosis   . Vaginal atrophy 04/18/2015  . Vaginal itching 04/18/2015    Past Surgical History:  Procedure Laterality Date  . Bilateral breast cyst  1964  . CATARACT EXTRACTION W/PHACO Right 03/02/2016   Procedure: CATARACT EXTRACTION PHACO AND INTRAOCULAR LENS PLACEMENT (IOC);  Surgeon: Tonny Branch, MD;  Location: [facility];  Service: Ophthalmology;  Laterality: Right;  CDE 10.57  . [place] [facility]  2010  . CHOLECYSTECTOMY     04/07/2011  . COLONOSCOPY     11/04/2010 Dr.Rourk  . [place] OF UTERUS  1972  . UPPER GASTROINTESTINAL ENDOSCOPY      Current Outpatient  Medications  Medication Sig Dispense Refill  . acetaminophen (TYLENOL) 500 MG tablet Take 1,000 mg by mouth as needed for mild pain or fever.     Marland Kitchen aspirin 81 MG tablet Take 81 mg by mouth daily.      Marland Kitchen atorvastatin (LIPITOR) 10 MG tablet Take 10 mg by mouth every other day.     . Calcium Carbonate Antacid (TUMS PO) Take 1 tablet by mouth as needed (heartburn).     Marland Kitchen dexlansoprazole (DEXILANT) 60 MG capsule Take 60 mg by mouth daily.    Marland Kitchen docusate sodium (COLACE) 100 MG capsule Take 100 mg by mouth daily as needed for mild constipation.    . hydrALAZINE (APRESOLINE) 25 MG tablet Take 25 mg by mouth in the morning and at bedtime.    Marland Kitchen KLOR-CON M20 20 MEQ tablet Take 20 mEq by mouth daily.     Marland Kitchen LORazepam (ATIVAN) 2 MG tablet Take 1 mg by mouth at bedtime.     Marland Kitchen losartan-hydrochlorothiazide (HYZAAR) 100-25 MG tablet Take 1 tablet by mouth daily.    . metoprolol succinate (TOPROL-XL) 100 MG 24 hr tablet Take 150 mg by mouth daily. Take 100 mg in the morning and 50 in the evening    . nystatin-triamcinolone ointment (MYCOLOG) Apply 1 application topically 2 (two) times daily. 30 g 0  . Polyethyl Glycol-Propyl Glycol (SYSTANE ULTRA OP) Apply 1 drop to eye 3 (three) times daily.    . verapamil (VERELAN PM) 240 MG 24  hr capsule Take 240 mg by mouth at bedtime.     No current facility-administered medications for this visit.   Allergies:  Tricyclic antidepressants   Social History: The patient  reports that she has never smoked. She has never used smokeless tobacco. She reports current alcohol use. She reports that she does not use drugs.   Family History: The patient's family history includes Alzheimer's disease in her maternal grandfather and mother; Dementia in her mother; Diabetes in her maternal grandmother; Healthy in her son; Heart disease in her father; Hypertension in her paternal grandfather; Stroke in her paternal grandfather; Tuberculosis in her maternal grandmother.   ROS:  Please see  the history of present illness. Otherwise, complete review of systems is positive for chronic back pain due to spinal stenosis.  All other systems are reviewed and negative.   Physical Exam: VS:  BP (!) 112/56   Pulse 61   Ht 5' (1.524 m)   Wt 134 lb (60.8 kg)   SpO2 98%   BMI 26.17 kg/m , BMI Body mass index is 26.17 kg/m.  Wt Readings from Last 3 Encounters:  03/12/20 134 lb (60.8 kg)  07/15/18 135 lb (61.2 kg)  02/26/16 140 lb (63.5 kg)    General: Elderly woman, appears comfortable at rest. HEENT: Conjunctiva and lids normal, wearing a mask. Neck: Supple, no elevated JVP or carotid bruits, no thyromegaly. Lungs: Clear to auscultation, nonlabored breathing at rest. Cardiac: Regular rate and rhythm, no S3, soft systolic murmur, no pericardial rub. Abdomen: Soft, nontender, bowel sounds present. Extremities: Trace ankle edema, distal pulses 2+. Skin: Warm and dry. Musculoskeletal: No kyphosis. Neuropsychiatric: Alert and oriented x3, affect grossly appropriate.  ECG:  An ECG dated 02/26/2016 was personally reviewed today and demonstrated:  Sinus rhythm.  Recent Labwork:  No recent lab work for review today.  Other Studies Reviewed Today:  Echocardiogram 09/26/2013: Study Conclusions   - Left ventricle: The cavity size was normal. Wall thickness  was increased in a pattern of mild LVH. Systolic function  was normal. The estimated ejection fraction was in the  range of 60% to 65%. Wall motion was normal; there were no  regional wall motion abnormalities. Doppler parameters are  consistent with abnormal left ventricular relaxation  (grade 1 diastolic dysfunction). Doppler parameters are  consistent with high ventricular filling pressure.  - Mitral valve: Calcified annulus. Trivial regurgitation.  - Right atrium: Central venous pressure: 56mm Hg (est).  - Atrial septum: No defect or patent foramen ovale was  identified.  - Tricuspid valve: Physiologic  regurgitation.  - Pulmonary arteries: Systolic pressure could not be  accurately estimated.  - Pericardium, extracardiac: A prominent pericardial fat pad  was present.   Impressions:   - Mild LVH with LVEF [id number], grade 1 diastolic dysfunction  with increased filling pressures. No major valvular  abnormalities. Unable to assess PASP. Prominent  pericardial fat pad.  Assessment and Plan:  1.  Recurrent chest tightness and decreased stamina as discussed above.  Symptoms are suggestive of angina, however she has been under stress and going through adjustment following the death of her husband back in 2022-12-18.  I reviewed her ECG today, overall nonspecific.  Cardiac risk factors include hypertension and hyperlipidemia as well as age.  We will plan to further evaluate cardiac structure and function with an echocardiogram and a Lexiscan Myoview.  No changes made to present medications.  She is already on aspirin and statin.  2.  Essential hypertension, well controlled today, she  continues on multimodal therapy under the direction of Dr. Willey Blade.  3.  Mixed hyperlipidemia, on statin therapy.  Medication Adjustments/Labs and Tests Ordered: Current medicines are reviewed at length with the patient today.  Concerns regarding medicines are outlined above.   Tests Ordered: Orders Placed This Encounter  Procedures  . NM Myocar Multi W/Spect W/Wall Motion / EF  . EKG 12-Lead  . ECHOCARDIOGRAM COMPLETE    Medication Changes: No orders of the defined types were placed in this encounter.   Disposition:  Follow up test results and determine next step.  Signed, Satira Sark, MD, [facility] 03/12/2020 9:28 AM    [facility], [place], [place] [address] Phone: [phone]; Fax: [phone]

## 2020-03-12 ENCOUNTER — Encounter: Payer: Self-pay | Admitting: *Deleted

## 2020-03-12 ENCOUNTER — Telehealth: Payer: Self-pay | Admitting: Cardiology

## 2020-03-12 ENCOUNTER — Other Ambulatory Visit: Payer: Self-pay

## 2020-03-12 ENCOUNTER — Encounter: Payer: Self-pay | Admitting: Cardiology

## 2020-03-12 ENCOUNTER — Ambulatory Visit: Payer: Medicare HMO | Admitting: Cardiology

## 2020-03-12 VITALS — BP 112/56 | HR 61 | Ht 60.0 in | Wt 134.0 lb

## 2020-03-12 DIAGNOSIS — E782 Mixed hyperlipidemia: Secondary | ICD-10-CM | POA: Diagnosis not present

## 2020-03-12 DIAGNOSIS — R0600 Dyspnea, unspecified: Secondary | ICD-10-CM

## 2020-03-12 DIAGNOSIS — R079 Chest pain, unspecified: Secondary | ICD-10-CM

## 2020-03-12 DIAGNOSIS — I10 Essential (primary) hypertension: Secondary | ICD-10-CM | POA: Diagnosis not present

## 2020-03-12 DIAGNOSIS — R0609 Other forms of dyspnea: Secondary | ICD-10-CM

## 2020-03-12 NOTE — Patient Instructions (Addendum)
Medication Instructions:   Your physician recommends that you continue on your current medications as directed. Please refer to the Current Medication list given to you today.  Labwork:  NONE  Testing/Procedures: Your physician has requested that you have an echocardiogram. Echocardiography is a painless test that uses sound waves to create images of your heart. It provides your doctor with information about the size and shape of your heart and how well your heart's chambers and valves are working. This procedure takes approximately one hour. There are no restrictions for this procedure. Your physician has requested that you have a lexiscan myoview. For further information please visit [email or web address]. Please follow instruction sheet, as given.  Follow-Up:  Your physician recommends that you schedule a follow-up appointment in: pending.   Any Other Special Instructions Will Be Listed Below (If Applicable).  If you need a refill on your cardiac medications before your next appointment, please call your pharmacy. 

## 2020-03-12 NOTE — Telephone Encounter (Signed)
Pre-cert Verification for the following procedure    Lexiscan myoview & Echo   DATE:  03/22/2020  LOCATION:  [facility]

## 2020-03-22 ENCOUNTER — Other Ambulatory Visit: Payer: Self-pay

## 2020-03-22 ENCOUNTER — Ambulatory Visit (HOSPITAL_BASED_OUTPATIENT_CLINIC_OR_DEPARTMENT_OTHER)
Admission: RE | Admit: 2020-03-22 | Discharge: 2020-03-22 | Disposition: A | Discharge status: Home / Self Care | Payer: Medicare HMO | Source: Ambulatory Visit | Attending: Cardiology | Admitting: Cardiology

## 2020-03-22 ENCOUNTER — Encounter (HOSPITAL_COMMUNITY)
Admission: RE | Admit: 2020-03-22 | Discharge: 2020-03-22 | Disposition: A | Discharge status: Home / Self Care | Payer: Medicare HMO | Source: Ambulatory Visit | Attending: Cardiology | Admitting: Cardiology

## 2020-03-22 ENCOUNTER — Ambulatory Visit (HOSPITAL_COMMUNITY)
Admission: RE | Admit: 2020-03-22 | Discharge: 2020-03-22 | Disposition: A | Discharge status: Home / Self Care | Payer: Medicare HMO | Source: Ambulatory Visit | Attending: Cardiology | Admitting: Cardiology

## 2020-03-22 DIAGNOSIS — R0600 Dyspnea, unspecified: Secondary | ICD-10-CM | POA: Diagnosis not present

## 2020-03-22 DIAGNOSIS — R079 Chest pain, unspecified: Secondary | ICD-10-CM

## 2020-03-22 DIAGNOSIS — R0609 Other forms of dyspnea: Secondary | ICD-10-CM

## 2020-03-22 LAB — NM MYOCAR MULTI W/SPECT W/WALL MOTION / EF
LV dias vol: 60 mL (ref 46–106)
LV sys vol: 16 mL
Peak HR: 86 {beats}/min
RATE: 0.29
Rest HR: 53 {beats}/min
SDS: 0
SRS: 0
SSS: 0
TID: 1.22

## 2020-03-22 MED ORDER — TECHNETIUM TC 99M TETROFOSMIN IV KIT
30.0000 | PACK | Freq: Once | INTRAVENOUS | Status: AC | PRN
  Administered 2020-03-22: 10:00:00 31 via INTRAVENOUS

## 2020-03-22 MED ORDER — TECHNETIUM TC 99M TETROFOSMIN IV KIT
10.0000 | PACK | Freq: Once | INTRAVENOUS | Status: AC | PRN
  Administered 2020-03-22: 10.57 via INTRAVENOUS

## 2020-03-22 MED ORDER — REGADENOSON 0.4 MG/5ML IV SOLN
INTRAVENOUS | Status: AC
  Administered 2020-03-22: 0.4 mg via INTRAVENOUS
  Filled 2020-03-22: qty 5

## 2020-03-22 MED ORDER — SODIUM CHLORIDE FLUSH 0.9 % IV SOLN
INTRAVENOUS | Status: AC
  Administered 2020-03-22: 10 mL via INTRAVENOUS
  Filled 2020-03-22: qty 10

## 2020-03-22 NOTE — Progress Notes (Signed)
*  PRELIMINARY RESULTS* Echocardiogram 2D Echocardiogram has been performed.  Samuel Germany 03/22/2020, 11:23 AM

## 2020-03-26 ENCOUNTER — Telehealth: Payer: Self-pay | Admitting: Cardiology

## 2020-03-26 NOTE — Telephone Encounter (Signed)
Patient informed. Copy sent to PCP °

## 2020-03-26 NOTE — Telephone Encounter (Signed)
-----   Message from Satira Sark, MD sent at 03/24/2020  4:12 PM EDT ----- Results reviewed.  Stress test was low risk and shows no perfusion defects to indicate obstructive CAD as cause of her symptoms.  Suggest continuing medical therapy which already includes aspirin, statin, beta-blocker, and ARB.  Keep follow-up with PCP.

## 2020-03-26 NOTE — Telephone Encounter (Signed)
-----   Message from Satira Sark, MD sent at 03/24/2020  4:13 PM EDT ----- Results reviewed.  LVEF is normal range without wall motion abnormalities.  Also no significant valvular abnormalities to correlate with symptoms.

## 2020-03-26 NOTE — Telephone Encounter (Signed)
Going out town and would like to know test results.   Wanted to make sure that it is ok for to leave

## 2020-08-01 ENCOUNTER — Encounter: Payer: Self-pay | Admitting: Cardiology

## 2020-08-01 ENCOUNTER — Other Ambulatory Visit: Payer: Self-pay

## 2020-08-01 ENCOUNTER — Other Ambulatory Visit (HOSPITAL_COMMUNITY)
Admission: RE | Admit: 2020-08-01 | Discharge: 2020-08-01 | Disposition: A | Discharge status: Home / Self Care | Payer: Medicare HMO | Source: Ambulatory Visit | Attending: Cardiology | Admitting: Cardiology

## 2020-08-01 ENCOUNTER — Ambulatory Visit: Payer: Medicare HMO | Admitting: Cardiology

## 2020-08-01 VITALS — BP 140/74 | HR 66 | Ht 60.0 in | Wt 135.6 lb

## 2020-08-01 DIAGNOSIS — R002 Palpitations: Secondary | ICD-10-CM

## 2020-08-01 DIAGNOSIS — R9431 Abnormal electrocardiogram [ECG] [EKG]: Secondary | ICD-10-CM

## 2020-08-01 DIAGNOSIS — I10 Essential (primary) hypertension: Secondary | ICD-10-CM | POA: Insufficient documentation

## 2020-08-01 LAB — COMPREHENSIVE METABOLIC PANEL
ALT: 15 U/L (ref 0–44)
AST: 18 U/L (ref 15–41)
Albumin: 4.2 g/dL (ref 3.5–5.0)
Alkaline Phosphatase: 48 U/L (ref 38–126)
Anion gap: 9 (ref 5–15)
BUN: 13 mg/dL (ref 8–23)
CO2: 31 mmol/L (ref 22–32)
Calcium: 9.4 mg/dL (ref 8.9–10.3)
Chloride: 85 mmol/L — ABNORMAL LOW (ref 98–111)
Creatinine, Ser: 0.68 mg/dL (ref 0.44–1.00)
GFR calc Af Amer: >60 mL/min (ref >60)
GFR calc non Af Amer: >60 mL/min (ref >60)
Glucose, Bld: 106 mg/dL — ABNORMAL HIGH (ref 70–99)
Potassium: 3.7 mmol/L (ref 3.5–5.1)
Sodium: 125 mmol/L — ABNORMAL LOW (ref 135–145)
Total Bilirubin: 0.6 mg/dL (ref 0.3–1.2)
Total Protein: 6.8 g/dL (ref 6.5–8.1)

## 2020-08-01 LAB — TSH: TSH: 1.667 u[IU]/mL (ref 0.350–4.500)

## 2020-08-01 LAB — MAGNESIUM: Magnesium: 1.7 mg/dL (ref 1.7–2.4)

## 2020-08-01 NOTE — Patient Instructions (Signed)
Medication Instructions:  Your physician recommends that you continue on your current medications as directed. Please refer to the Current Medication list given to you today.  *If you need a refill on your cardiac medications before your next appointment, please call your pharmacy*   Lab Work: Your physician recommends that you return for lab work in: Today   If you have labs (blood work) drawn today and your tests are completely normal, you will receive your results only by:  MyChart Message (if you have MyChart) OR  A paper copy in the mail If you have any lab test that is abnormal or we need to change your treatment, we will call you to review the results.   Testing/Procedures: Your physician has recommended that you wear an event monitor. Event monitors are medical devices that record the hearts electrical activity. Doctors most often [place] these monitors to diagnose arrhythmias. Arrhythmias are problems with the speed or rhythm of the heartbeat. The monitor is a small, portable device. You can wear one while you do your normal daily activities. This is usually used to diagnose what is causing palpitations/syncope (passing out).   Follow-Up: At [facility], you and your health needs are our priority.  As part of our continuing mission to provide you with exceptional heart care, we have created designated Provider Care Teams.  These Care Teams include your primary Cardiologist (physician) and Advanced Practice Providers (APPs -  Physician Assistants and Nurse Practitioners) who all work together to provide you with the care you need, when you need it.  We recommend signing up for the patient portal called "MyChart".  Sign up information is provided on this After Visit Summary.  MyChart is used to connect with patients for Virtual Visits (Telemedicine).  Patients are able to view lab/test results, encounter notes, upcoming appointments, etc.  Non-urgent messages can be sent to your  provider as well.   To learn more about what you can do with MyChart, go to [email or web address].    Your next appointment:   6-7 week(s)  The format for your next appointment:   In Person  Provider:   Rozann Lesches, MD   Other Instructions Thank you for choosing [facility]!

## 2020-08-01 NOTE — Progress Notes (Signed)
Cardiology Office Note   Date:  08/01/2020   ID:  [name], DOB 02-11-1931, MRN [id number]  PCP:  Nicole Noble, MD  Cardiologist:  Dr. Domenic Polite    Chief Complaint  Patient presents with  . Palpitations    dizziness      History of Present Illness: [name] is a 86 y.o. female who presents for palpitations.  Was seen in April with chest tightness and decreased stamina. Echo was done with normal EF and no significant valvular abnormalities  nuc study 02/2020 was normal.  Low risk.   Now referred for palpitations. In past reportedly symptomatic PVCs.   She has been dizzy she describes as lightheadedness, like she could pass out.  She has had for some time.  She also has palpitations some are like irregular HR and other times palpitations.  With the dizziness her PCP decreased her metoprolol from 150 mg daily to 50 mg daily.  Her BP has not changed and not much change in her symptoms.  She takes up to 50 mg of meclizine and that seems to helps some with the dizziness.  She has seen ENT remotely.  The dizziness is a chronic issue.    EKG without acute changes, though Qtc is longer.  No chest pain or SOB.  She has rec'd both covid vaccines.   Past Medical History:  Diagnosis Date  . Anxiety   . Essential hypertension   . GERD (gastroesophageal reflux disease)   . Hyperlipidemia   . Palpitations    Reportedly symptomatic PVCs   . Spinal stenosis   . Vaginal atrophy 04/18/2015  . Vaginal itching 04/18/2015    Past Surgical History:  Procedure Laterality Date  . Bilateral breast cyst  1964  . CATARACT EXTRACTION W/PHACO Right 03/02/2016   Procedure: CATARACT EXTRACTION PHACO AND INTRAOCULAR LENS PLACEMENT (IOC);  Surgeon: Tonny Branch, MD;  Location: [facility];  Service: Ophthalmology;  Laterality: Right;  CDE 10.57  . [facility]  2010  . CHOLECYSTECTOMY     04/07/2011  . COLONOSCOPY     11/04/2010 Dr.Rourk  . [place] [facility]  1972  . UPPER  GASTROINTESTINAL ENDOSCOPY       Current Outpatient Medications  Medication Sig Dispense Refill  . acetaminophen (TYLENOL) 500 MG tablet Take 1,000 mg by mouth as needed for mild pain or fever.     Marland Kitchen aspirin 81 MG tablet Take 81 mg by mouth daily.      Marland Kitchen dexlansoprazole (DEXILANT) 60 MG capsule Take 60 mg by mouth daily.    Marland Kitchen docusate sodium (COLACE) 100 MG capsule Take 100 mg by mouth daily as needed for mild constipation.    . Famotidine (PEPCID PO) Take by mouth.    . hydrALAZINE (APRESOLINE) 25 MG tablet Take 25 mg by mouth in the morning and at bedtime.    Marland Kitchen KLOR-CON M20 20 MEQ tablet Take 20 mEq by mouth daily.     Marland Kitchen LORazepam (ATIVAN) 2 MG tablet Take 1 mg by mouth at bedtime.     Marland Kitchen losartan-hydrochlorothiazide (HYZAAR) 100-25 MG tablet Take 1 tablet by mouth daily.    . metoprolol succinate (TOPROL-XL) 100 MG 24 hr tablet Take 100 mg by mouth daily. Take 50 mg in the morning and 50 in the evening    . nystatin-triamcinolone ointment (MYCOLOG) Apply 1 application topically 2 (two) times daily. 30 g 0  . Polyethyl Glycol-Propyl Glycol (SYSTANE ULTRA OP) Apply 1 drop to eye 3 (  three) times daily.    . verapamil (VERELAN PM) 240 MG 24 hr capsule Take 240 mg by mouth at bedtime.     No current facility-administered medications for this visit.    Allergies:   Tricyclic antidepressants    Social History:  The patient  reports that she has never smoked. She has never used smokeless tobacco. She reports current alcohol use. She reports that she does not use drugs.   Family History:  The patient's family history includes Alzheimer's disease in her maternal grandfather and mother; Dementia in her mother; Diabetes in her maternal grandmother; Healthy in her son; Heart disease in her father; Hypertension in her paternal grandfather; Stroke in her paternal grandfather; Tuberculosis in her maternal grandmother.    ROS:  General:no colds or fevers, no weight changes Skin:no rashes or  ulcers HEENT:no blurred vision, no congestion CV:see HPI PUL:see HPI GI:no diarrhea constipation or melena, no indigestion GU:no hematuria, no dysuria MS:no joint pain, no claudication Neuro:no syncope, + lightheadedness Endo:no diabetes, no thyroid disease  Wt Readings from Last 3 Encounters:  08/01/20 135 lb 9.6 oz (61.5 kg)  03/12/20 134 lb (60.8 kg)  07/15/18 135 lb (61.2 kg)     PHYSICAL EXAM: VS:  BP 140/74   Pulse 66   Ht 5' (1.524 m)   Wt 135 lb 9.6 oz (61.5 kg)   SpO2 97%   BMI 26.48 kg/m  , BMI Body mass index is 26.48 kg/m. General:Pleasant affect, NAD Skin:Warm and dry, brisk capillary refill HEENT:normocephalic, sclera clear, mucus membranes moist Neck:supple, no JVD, no bruits  Heart:S1S2 RRR without murmur, gallup, rub or click Lungs:clear without rales, rhonchi, or wheezes DJS:HFWY, non tender, + BS, do not palpate liver spleen or masses Ext:no lower ext edema, 2+ pedal pulses, 2+ radial pulses Neuro:alert and oriented X 3, MAE, follows commands, + facial symmetry    EKG:  EKG is ordered today. The ekg ordered today demonstrates SR with non specific T wave abnormality and prolonged Qtc of 473 when I measure QT in II my QTC is 423 and in other leads Qtc of 467  nonspecific R' in lead V1   Recent Labs: No results found for requested labs within last 8760 hours.    Lipid Panel No results found for: CHOL, TRIG, HDL, CHOLHDL, VLDL, LDLCALC, LDLDIRECT     Other studies Reviewed: Additional studies/ records that were reviewed today include: . ECHO 03/22/20 IMPRESSIONS    1. Left ventricular ejection fraction, by estimation, is 60 to 65%. The  left ventricle has normal function. The left ventricle has no regional  wall motion abnormalities. Left ventricular diastolic parameters are  consistent with Grade I diastolic  dysfunction (impaired relaxation). Elevated left atrial pressure.  2. Right ventricular systolic function is normal. The right  ventricular  size is normal.  3. The mitral valve is normal in structure. Trivial mitral valve  regurgitation. No evidence of mitral stenosis.  4. The aortic valve is tricuspid. Aortic valve regurgitation is not  visualized. No aortic stenosis is present.  5. The inferior vena cava is normal in size with greater than 50%  respiratory variability, suggesting right atrial pressure of 3 mmHg.   FINDINGS  Left Ventricle: Left ventricular ejection fraction, by estimation, is 60  to 65%. The left ventricle has normal function. The left ventricle has no  regional wall motion abnormalities. The left ventricular internal cavity  size was normal in size. There is  no left ventricular hypertrophy. Left ventricular diastolic parameters  are consistent with Grade I diastolic dysfunction (impaired relaxation).  Elevated left atrial pressure.   Right Ventricle: The right ventricular size is normal. No increase in  right ventricular wall thickness. Right ventricular systolic function is  normal.   Left Atrium: Left atrial size was normal in size.   Right Atrium: Right atrial size was normal in size.   Pericardium: There is no evidence of pericardial effusion.   Mitral Valve: The mitral valve is normal in structure. Trivial mitral  valve regurgitation. No evidence of mitral valve stenosis.   Tricuspid Valve: The tricuspid valve is normal in structure. Tricuspid  valve regurgitation is not demonstrated. No evidence of tricuspid  stenosis.   Aortic Valve: The aortic valve is tricuspid. Aortic valve regurgitation is  not visualized. No aortic stenosis is present.   Pulmonic Valve: The pulmonic valve was not well visualized. Pulmonic valve  regurgitation is not visualized. No evidence of pulmonic stenosis.   Aorta: The aortic root is normal in size and structure.   Pulmonary Artery: Indeterminant PASP inadequate TR jet.   Venous: The inferior vena cava is normal in size with greater than  50%  respiratory variability, suggesting right atrial pressure of 3 mmHg.   IAS/Shunts: No atrial level shunt detected by color flow Doppler.    NUC study 03/22/20 Narrative & Impression   There was no ST segment deviation noted during stress.  The study is normal. There are no perfusion defects.  This is a low risk study.  The left ventricular ejection fraction is hyperdynamic (>65%).     ASSESSMENT AND PLAN:  1.  palpitations and lightheadedness may be an arrhythmia, with age concern for PAF.  Will have her wear 30 day event monitor.  Check TSH, CMP, Mg+ level.  She will follow up in 6-7 weeks.    2.  QT is longer than has been will check labs as above.    3.  Recent eval for chest tightness with low risk myoview and Echo was fairly normal as well, reassuring.  Reviewed with her today.      Current medicines are reviewed with the patient today.  The patient Has no concerns regarding medicines.  The following changes have been made:  See above Labs/ tests ordered today include:see above  Disposition:   FU:  see above  Signed, Cecilie Kicks, NP  08/01/2020 4:10 PM    [facility] [place], [address] [place], [place] Phone: [phone]; Fax: [phone]

## 2020-08-05 ENCOUNTER — Telehealth: Payer: Self-pay

## 2020-08-05 MED ORDER — LOSARTAN POTASSIUM 100 MG PO TABS: 100.0000 mg | ORAL_TABLET | Freq: Every day | ORAL | 3 refills | Status: DC

## 2020-08-05 NOTE — Telephone Encounter (Signed)
Pt informed of lab results.She will stop Losartan/HCTZ and will now take Losartan 100 mg qd   E-scribed to [employer]

## 2020-08-05 NOTE — Telephone Encounter (Signed)
-----   Message from Isaiah Serge, NP sent at 08/02/2020 11:20 AM EDT ----- Please let pt know her sodium in her blood is low, this may be due to the HCTZ in her medication so I will stop losartan HCTZ and change to losartan alone at 100 mg .  If you could send in new prescription as well.  Then lets check BMP on Tuesday next week.

## 2020-08-07 ENCOUNTER — Telehealth: Payer: Self-pay | Admitting: Student

## 2020-08-07 NOTE — Telephone Encounter (Signed)
New message    Update- patient wants to know if she needs to wear monitor since she is feeling better?  She seen Cecilie Kicks on 08/01/20 and Mickel Baas had her stop her fluid pill. Since stopping fluid pill she has more energy, no palpitations, and is feeling a lot better, please call to advise .

## 2020-08-07 NOTE — Telephone Encounter (Signed)
Pt states that she will wear monitor when it comes.

## 2020-08-08 NOTE — Telephone Encounter (Signed)
If she does not want to then ok but may show issues anyway so would prefer to wear.

## 2020-08-12 ENCOUNTER — Telehealth: Payer: Self-pay | Admitting: Cardiology

## 2020-08-12 ENCOUNTER — Other Ambulatory Visit: Payer: Self-pay

## 2020-08-12 ENCOUNTER — Ambulatory Visit (INDEPENDENT_AMBULATORY_CARE_PROVIDER_SITE_OTHER): Payer: Medicare HMO

## 2020-08-12 DIAGNOSIS — R002 Palpitations: Secondary | ICD-10-CM | POA: Diagnosis not present

## 2020-08-12 NOTE — Addendum Note (Signed)
Addended by: Levonne Hubert on: 08/12/2020 12:13 PM   Modules accepted: Orders

## 2020-08-12 NOTE — Telephone Encounter (Signed)
Pt states she is having problems putting on monitor.    Please call [phone]    thanks

## 2020-08-12 NOTE — Telephone Encounter (Signed)
Advised to come by the office today at 10:00 am to get help putting on monitor.

## 2020-09-04 ENCOUNTER — Telehealth: Payer: Self-pay | Admitting: Student

## 2020-09-04 DIAGNOSIS — Z79899 Other long term (current) drug therapy: Secondary | ICD-10-CM

## 2020-09-04 MED ORDER — FUROSEMIDE 20 MG PO TABS: 20.0000 mg | ORAL_TABLET | Freq: Every day | ORAL | 3 refills | Status: DC

## 2020-09-04 MED ORDER — POTASSIUM CHLORIDE ER 10 MEQ PO TBCR: 10.0000 meq | EXTENDED_RELEASE_TABLET | Freq: Every day | ORAL | 3 refills | Status: DC

## 2020-09-04 NOTE — Telephone Encounter (Signed)
Spoke with pt who states that her ankles are 3 times bigger after stopping HCTZ. Pt c/o SOB when walking up steps or to the mailbox. Pt states "I just don't feel good". Denies chest pain. Current weight is 142.5 on home scale. Pt has not weighed since last office visit where at that time her weight in the office was 135 lb. She reports feeling lightheaded at times. Please advise.

## 2020-09-04 NOTE — Telephone Encounter (Signed)
New message     Patient is wearing monitor can she take it off tomorrow so that she can have a spinal epidural    Pt c/o swelling: STAT is pt has developed SOB within 24 hours  1) How much weight have you gained and in what time span? 142.5   If swelling, where is the swelling located? legs 2) Are you currently taking a fluid pill? No   3) Are you currently SOB? No really fatigued  Do you have a log of your daily weights (if so, list)? no 4) Have you gained 3 pounds in a day or 5 pounds in a week? 136 last visit   5) Have you traveled recently? no

## 2020-09-04 NOTE — Telephone Encounter (Signed)
I reviewed the chart. Patient was most recently seen by Ms. Ingold NP with adjustments made to her medications and lab work obtained.  She also arranged for the heart monitor that she has been wearing.  If she is having worsening leg edema and her weight is truly up 7 lbs., let's start her on Lasix 20 mg daily with KCl 10 mEq daily.  She will need to have a follow-up BMET in 7 to 10 days and please get her an office visit with APP in the next 2 weeks.

## 2020-09-04 NOTE — Telephone Encounter (Signed)
Pt notified and voiced understanding 

## 2020-09-08 NOTE — Progress Notes (Signed)
Cardiology Office Note   Date:  09/11/2020   ID:  [name], DOB 1931-09-01, MRN [id number]  PCP:  Asencion Noble, MD  Cardiologist:  Dr. Domenic Polite    Chief Complaint  Patient presents with  . Dizziness    [place] of Present Illness: [name] is a 86 y.o. female who presents for increasing leg edema.  Was seen in April with chest tightness and decreased stamina. Echo was done with normal EF and no significant valvular abnormalities  nuc study 02/2020 was normal.  Low risk.   Now referred for palpitations. In past reportedly symptomatic PVCs.   She has been dizzy she describes as lightheadedness, like she could pass out.  She has had for some time.  She also has palpitations some are like irregular HR and other times palpitations.  With the dizziness her PCP decreased her metoprolol from 150 mg daily to 50 mg daily.  Her BP has not changed and not much change in her symptoms.  She takes up to 50 mg of meclizine and that seems to helps some with the dizziness.  She has seen ENT remotely.  The dizziness is a chronic issue.    EKG without acute changes, though Qtc is longer.  No chest pain or SOB.  She has rec'd both covid vaccines.   Last visit Na was 125 HCTZ was stopped she never did follow up labs.   Now with leg edema.  Lasix was prescribed.  sheis better. No chest pain but + Dizziness some palpitations.  Monitor strips we have reviewed with SB in 50s to 52,  Her BP is elevated to 174/84  Stress test in April low risk study. occ chest heaviness but mostly dizzy.  She is asking about cardiac cath but believe her BP needs better control.  Her last Na was low will recheck. After we stopped HCTZ in Sept her dizziness and palpitations improved.    Past Medical History:  Diagnosis Date  . Anxiety   . Essential hypertension   . GERD (gastroesophageal reflux disease)   . Hyperlipidemia   . Palpitations    Reportedly symptomatic PVCs   . Spinal stenosis   . Vaginal atrophy  04/18/2015  . Vaginal itching 04/18/2015    Past Surgical History:  Procedure Laterality Date  . Bilateral breast cyst  1964  . CATARACT EXTRACTION W/PHACO Right 03/02/2016   Procedure: CATARACT EXTRACTION PHACO AND INTRAOCULAR LENS PLACEMENT (IOC);  Surgeon: Tonny Branch, MD;  Location: [facility];  Service: Ophthalmology;  Laterality: Right;  CDE 10.57  . [facility]  2010  . CHOLECYSTECTOMY     04/07/2011  . COLONOSCOPY     11/04/2010 Dr.Rourk  . [place] OF [facility]TER[facility]  1972  . UPPER GASTROINTESTINAL ENDOSCOPY       Current Outpatient Medications  Medication Sig Dispense Refill  . acetaminophen (TYLENOL) 500 MG tablet Take 1,000 mg by mouth as needed for mild pain or fever.     Marland Kitchen aspirin 81 MG tablet Take 81 mg by mouth daily.      Marland Kitchen docusate sodium (COLACE) 100 MG capsule Take 100 mg by mouth daily as needed for mild constipation.    . Famotidine (PEPCID PO) Take by mouth.    . furosemide (LASIX) 20 MG tablet Take 1 tablet (20 mg total) by mouth daily. 90 tablet 3  . KLOR-CON M20 20 MEQ tablet Take 20 mEq by mouth daily.     Marland Kitchen LORazepam (ATIVAN) 2 MG tablet  Take 1 mg by mouth at bedtime.     Marland Kitchen losartan (COZAAR) 100 MG tablet Take 1 tablet (100 mg total) by mouth daily. 90 tablet 3  . Polyethyl Glycol-Propyl Glycol (SYSTANE ULTRA OP) Apply 1 drop to eye 3 (three) times daily.    . verapamil (VERELAN PM) 240 MG 24 hr capsule Take 240 mg by mouth at bedtime.    . hydrALAZINE (APRESOLINE) 50 MG tablet Take 1 tablet (50 mg total) by mouth 3 (three) times daily. 270 tablet 3  . metoprolol succinate (TOPROL-XL) 50 MG 24 hr tablet Take 1 tablet (50 mg total) by mouth daily. Take with or immediately following a meal. 90 tablet 3   No current facility-administered medications for this visit.    Allergies:   Tricyclic antidepressants    Social History:  The patient  reports that she has never smoked. She has never used smokeless tobacco. She reports current alcohol use. She  reports that she does not use drugs.   Family History:  The patient's family history includes Alzheimer's disease in her maternal grandfather and mother; Dementia in her mother; Diabetes in her maternal grandmother; Healthy in her son; Heart disease in her father; Hypertension in her paternal grandfather; Stroke in her paternal grandfather; Tuberculosis in her maternal grandmother.    ROS:  General:no colds or fevers, no weight changes Skin:no rashes or ulcers HEENT:no blurred vision, no congestion CV:see HPI PUL:see HPI GI:no diarrhea constipation or melena, no indigestion GU:no hematuria, no dysuria MS:no joint pain, no claudication Neuro:no syncope, no lightheadedness Endo:no diabetes, no thyroid disease  Wt Readings from Last 3 Encounters:  09/09/20 137 lb 9.6 oz (62.4 kg)  08/01/20 135 lb 9.6 oz (61.5 kg)  03/12/20 134 lb (60.8 kg)     PHYSICAL EXAM: VS:  BP (!) 174/84   Pulse 61   Ht 4\' 11"  (1.499 m)   Wt 137 lb 9.6 oz (62.4 kg)   SpO2 97%   BMI 27.79 kg/m  , BMI Body mass index is 27.79 kg/m. General:Pleasant affect, NAD Skin:Warm and dry, brisk capillary refill HEENT:normocephalic, sclera clear, mucus membranes moist Neck:supple, no JVD, no bruits  Heart:S1S2 RRR without murmur, gallup, rub or click Lungs:clear without rales, rhonchi, or wheezes WJX:BJYN, non tender, + BS, do not palpate liver spleen or masses Ext:no lower ext edema, 2+ pedal pulses, 2+ radial pulses Neuro:alert and oriented X 3, MAE, follows commands, + facial symmetry    EKG:  EKG is NOT ordered today.   Recent Labs: 08/01/2020: ALT 15; TSH 1.667 09/09/2020: BUN 15; Creatinine, Ser 0.80; Magnesium 1.9; Potassium 3.8; Sodium 137    Lipid Panel No results found for: CHOL, TRIG, HDL, CHOLHDL, VLDL, LDLCALC, LDLDIRECT     Other studies Reviewed: Additional studies/ records that were reviewed today include: monitor results pending .  ECHO 03/22/20 IMPRESSIONS    1. Left ventricular  ejection fraction, by estimation, is 60 to 65%. The  left ventricle has normal function. The left ventricle has no regional  wall motion abnormalities. Left ventricular diastolic parameters are  consistent with Grade I diastolic  dysfunction (impaired relaxation). Elevated left atrial pressure.  2. Right ventricular systolic function is normal. The right ventricular  size is normal.  3. The mitral valve is normal in structure. Trivial mitral valve  regurgitation. No evidence of mitral stenosis.  4. The aortic valve is tricuspid. Aortic valve regurgitation is not  visualized. No aortic stenosis is present.  5. The inferior vena cava is normal in  size with greater than 50%  respiratory variability, suggesting right atrial pressure of 3 mmHg.   FINDINGS  Left Ventricle: Left ventricular ejection fraction, by estimation, is 60  to 65%. The left ventricle has normal function. The left ventricle has no  regional wall motion abnormalities. The left ventricular internal cavity  size was normal in size. There is  no left ventricular hypertrophy. Left ventricular diastolic parameters  are consistent with Grade I diastolic dysfunction (impaired relaxation).  Elevated left atrial pressure.   Right Ventricle: The right ventricular size is normal. No increase in  right ventricular wall thickness. Right ventricular systolic function is  normal.   Left Atrium: Left atrial size was normal in size.   Right Atrium: Right atrial size was normal in size.   Pericardium: There is no evidence of pericardial effusion.   Mitral Valve: The mitral valve is normal in structure. Trivial mitral  valve regurgitation. No evidence of mitral valve stenosis.   Tricuspid Valve: The tricuspid valve is normal in structure. Tricuspid  valve regurgitation is not demonstrated. No evidence of tricuspid  stenosis.   Aortic Valve: The aortic valve is tricuspid. Aortic valve regurgitation is  not visualized. No  aortic stenosis is present.   Pulmonic Valve: The pulmonic valve was not well visualized. Pulmonic valve  regurgitation is not visualized. No evidence of pulmonic stenosis.   Aorta: The aortic root is normal in size and structure.   Pulmonary Artery: Indeterminant PASP inadequate TR jet.   Venous: The inferior vena cava is normal in size with greater than 50%  respiratory variability, suggesting right atrial pressure of 3 mmHg.   IAS/Shunts: No atrial level shunt detected by color flow Doppler.    NUC study 03/22/20 Narrative & Impression   There was no ST segment deviation noted during stress.  The study is normal. There are no perfusion defects.  This is a low risk study.  The left ventricular ejection fraction is hyperdynamic (>65%).    ASSESSMENT AND PLAN:  1.  palpitations improved and event monitor pending  Still with lightheadedness, palpitations did improve with stopping HCTZ  2.  Lower ext edema off HCTZ now on lasix with improvement   3.  Recent myoview normal no pain but fatigue.  Lightheadedness BP still elevated so will adjust and see if she will improve.  4.  HTN will increase hydralazine to 50 mg TID  With slower HR on part of monitor reviewed. Will decrease BB to 50 mg dialy.   5.  Sinus Brady decrease BB.   6.  hyponatremia with HCTZ will check BMP today     Current medicines are reviewed with the patient today.  The patient Has no concerns regarding medicines.  The following changes have been made:  See above Labs/ tests ordered today include:see above  Disposition:   FU:  see above  Signed, Cecilie Kicks, NP  09/11/2020 4:19 PM    [facility] [place], [address] [place], [place] Phone: [phone]; Fax: [phone]

## 2020-09-09 ENCOUNTER — Encounter: Payer: Self-pay | Admitting: Cardiology

## 2020-09-09 ENCOUNTER — Other Ambulatory Visit (HOSPITAL_COMMUNITY)
Admission: RE | Admit: 2020-09-09 | Discharge: 2020-09-09 | Disposition: A | Discharge status: Home / Self Care | Payer: Medicare HMO | Source: Ambulatory Visit | Attending: Cardiology | Admitting: Cardiology

## 2020-09-09 ENCOUNTER — Telehealth: Payer: Self-pay | Admitting: Cardiology

## 2020-09-09 ENCOUNTER — Ambulatory Visit: Payer: Medicare HMO | Admitting: Cardiology

## 2020-09-09 ENCOUNTER — Other Ambulatory Visit: Payer: Self-pay

## 2020-09-09 VITALS — BP 174/84 | HR 61 | Ht 59.0 in | Wt 137.6 lb

## 2020-09-09 DIAGNOSIS — E871 Hypo-osmolality and hyponatremia: Secondary | ICD-10-CM | POA: Insufficient documentation

## 2020-09-09 DIAGNOSIS — R001 Bradycardia, unspecified: Secondary | ICD-10-CM

## 2020-09-09 DIAGNOSIS — R600 Localized edema: Secondary | ICD-10-CM | POA: Diagnosis not present

## 2020-09-09 DIAGNOSIS — I10 Essential (primary) hypertension: Secondary | ICD-10-CM | POA: Diagnosis not present

## 2020-09-09 DIAGNOSIS — R002 Palpitations: Secondary | ICD-10-CM

## 2020-09-09 LAB — BASIC METABOLIC PANEL
Anion gap: 9 (ref 5–15)
BUN: 15 mg/dL (ref 8–23)
CO2: 29 mmol/L (ref 22–32)
Calcium: 9.2 mg/dL (ref 8.9–10.3)
Chloride: 99 mmol/L (ref 98–111)
Creatinine, Ser: 0.8 mg/dL (ref 0.44–1.00)
GFR, Estimated: >60 mL/min (ref >60)
Glucose, Bld: 99 mg/dL (ref 70–99)
Potassium: 3.8 mmol/L (ref 3.5–5.1)
Sodium: 137 mmol/L (ref 135–145)

## 2020-09-09 LAB — MAGNESIUM: Magnesium: 1.9 mg/dL (ref 1.7–2.4)

## 2020-09-09 MED ORDER — HYDRALAZINE HCL 50 MG PO TABS: 50.0000 mg | ORAL_TABLET | Freq: Three times a day (TID) | ORAL | 3 refills | Status: DC

## 2020-09-09 MED ORDER — METOPROLOL SUCCINATE ER 50 MG PO TB24: 50.0000 mg | ORAL_TABLET | Freq: Every day | ORAL | 3 refills | Status: DC

## 2020-09-09 NOTE — Patient Instructions (Addendum)
Medication Instructions:  Your physician has recommended you make the following change in your medication:  1.  DECREASE the Toprol to 50 mg daily 2.  INCREASE the Hydralazine to 50 mg taking three times a day   *If you need a refill on your cardiac medications before your next appointment, please call your pharmacy*   Lab Work: TODAY:  BMET & MAGNESIUM  If you have labs (blood work) drawn today and your tests are completely normal, you will receive your results only by:  [place] (if you have MyChart) OR  A paper copy in the mail If you have any lab test that is abnormal or we need to change your treatment, we will call you to review the results.   Testing/Procedures: None ordered   Follow-Up: At [facility], you and your health needs are our priority.  As part of our continuing mission to provide you with exceptional heart care, we have created designated Provider Care Teams.  These Care Teams include your primary Cardiologist (physician) and Advanced Practice Providers (APPs -  Physician Assistants and Nurse Practitioners) who all work together to provide you with the care you need, when you need it.  We recommend signing up for the patient portal called "MyChart".  Sign up information is provided on this After Visit Summary.  MyChart is used to connect with patients for Virtual Visits (Telemedicine).  Patients are able to view lab/test results, encounter notes, upcoming appointments, etc.  Non-urgent messages can be sent to your provider as well.   To learn more about what you can do with MyChart, go to [email or web address].    Your next appointment:   09/24/19 as scheduled  The format for your next appointment:   In Person  Provider:   You will see one of the following Advanced Practice Providers on your designated Care Team:    Bernerd Pho, [place]    Other Instructions

## 2020-09-09 NOTE — Telephone Encounter (Signed)
-----   Message from Isaiah Serge, NP sent at 09/09/2020  1:58 PM EDT ----- Phebe Colla, labs have returned to normal. Continue meds like we said on OV.

## 2020-09-09 NOTE — Telephone Encounter (Signed)
Follow up:      Patient returning a call back to get results.

## 2020-09-09 NOTE — Telephone Encounter (Signed)
Returned call to pt and she has been made aware of her lab results and she verbalized understanding.

## 2020-09-10 ENCOUNTER — Telehealth: Payer: Self-pay | Admitting: Cardiology

## 2020-09-10 NOTE — Telephone Encounter (Signed)
Pt states that she will disregard lab sheet d/t having labs done by Cecilie Kicks.

## 2020-09-10 NOTE — Telephone Encounter (Signed)
New Message:    Please call, concerning letter she received about getting lab work.

## 2020-09-11 ENCOUNTER — Encounter: Payer: Self-pay | Admitting: Cardiology

## 2020-09-12 NOTE — Progress Notes (Signed)
I sent a message to Dr. Domenic Polite - will see what he thinks.

## 2020-09-16 ENCOUNTER — Ambulatory Visit: Payer: Medicare HMO | Admitting: Cardiology

## 2020-09-24 ENCOUNTER — Ambulatory Visit: Payer: Medicare HMO | Admitting: Student

## 2020-09-24 ENCOUNTER — Encounter: Payer: Self-pay | Admitting: Student

## 2020-09-24 ENCOUNTER — Other Ambulatory Visit: Payer: Self-pay

## 2020-09-24 VITALS — BP 128/58 | HR 85 | Ht 60.0 in | Wt 137.8 lb

## 2020-09-24 DIAGNOSIS — R002 Palpitations: Secondary | ICD-10-CM

## 2020-09-24 DIAGNOSIS — I10 Essential (primary) hypertension: Secondary | ICD-10-CM

## 2020-09-24 DIAGNOSIS — R600 Localized edema: Secondary | ICD-10-CM

## 2020-09-24 DIAGNOSIS — E871 Hypo-osmolality and hyponatremia: Secondary | ICD-10-CM

## 2020-09-24 MED ORDER — LOSARTAN POTASSIUM-HCTZ 100-12.5 MG PO TABS: 1.0000 | ORAL_TABLET | Freq: Every day | ORAL | 3 refills | Status: DC

## 2020-09-24 NOTE — Progress Notes (Signed)
Cardiology Office Note    Date:  09/24/2020   ID:  ORRA NOLDE, DOB 04-27-1931, MRN [id number]  PCP:  Asencion Noble, MD  Cardiologist: Rozann Lesches, MD    Chief Complaint  Patient presents with  . Follow-up    3 week visit    History of Present Illness:    KRISTIANA JACKO is a 86 y.o. female with past medical history of prior chest pain (low-risk NST in 02/2020), palpitations (PVC's by prior monitoring) and HTN who presents to the office today for 3-week follow-up.  She was examined by Cecilie Kicks, NP in 07/2020 for evaluation of dizziness and palpitations. Was utilizing Meclizine with improvement in her symptoms. Routine labs were obtained and a 30-day monitor was also recommended. Labs showed her TSH was within a normal range but she did have hyponatremia with Na+ at 125 and it was recommended she stop HCTZ. She did follow-up with Mickel Baas on 09/09/2020 and reported her dizziness had improved with stopping HCTZ but she did require Lasix in the interim due to worsening edema. Her monitor did show predominately sinus rhythm with episodes of junctional rhythm and HR ranging from 41 bpm to 118 bpm and an average of 66 bpm. Her Toprol-XL was reduced to 50mg  daily and Hydralazine was increased to 50mg  TID given her elevated BP. Repeat labs did show her Na+ had normalized to 137 and renal function was within a normal range.  In talking with the patient today, she reports her dizziness and fatigue significantly worsened after being switched to Lasix and she therefore discontinued this and went back to Losartan-HCTZ. Also self-reduced Hydralazine to 50mg  BID. She still has intermittent episodes of dizziness but feels like her symptoms have significantly improved. She denies any recent chest pain, dyspnea on exertion, orthopnea or PND. She did experience worsening edema after HCTZ was previously discontinued. She also reports occasional palpitations but denies any persistent symptoms.  She did not  notice any progression of symptoms following dose reduction of Toprol-XL.  Past Medical History:  Diagnosis Date  . Anxiety   . Essential hypertension   . GERD (gastroesophageal reflux disease)   . Hyperlipidemia   . Palpitations    Reportedly symptomatic PVCs   . Spinal stenosis   . Vaginal atrophy 04/18/2015  . Vaginal itching 04/18/2015    Past Surgical History:  Procedure Laterality Date  . Bilateral breast cyst  1964  . CATARACT EXTRACTION W/PHACO Right 03/02/2016   Procedure: CATARACT EXTRACTION PHACO AND INTRAOCULAR LENS PLACEMENT (IOC);  Surgeon: Tonny Branch, MD;  Location: [facility];  Service: Ophthalmology;  Laterality: Right;  CDE 10.57  . [facility]  2010  . CHOLECYSTECTOMY     04/07/2011  . COLONOSCOPY     11/04/2010 Dr.Rourk  . [facility]  1972  . UPPER GASTROINTESTINAL ENDOSCOPY      Current Medications: Outpatient Medications Prior to Visit  Medication Sig Dispense Refill  . acetaminophen (TYLENOL) 500 MG tablet Take 1,000 mg by mouth as needed for mild pain or fever.     Marland Kitchen aspirin 81 MG tablet Take 81 mg by mouth daily.      Marland Kitchen docusate sodium (COLACE) 100 MG capsule Take 100 mg by mouth daily as needed for mild constipation.    . Famotidine (PEPCID PO) Take by mouth.    . hydrALAZINE (APRESOLINE) 50 MG tablet Take 1 tablet (50 mg total) by mouth 3 (three) times daily. (Patient taking differently: Take 50 mg by mouth  in the morning and at bedtime. ) 270 tablet 3  . KLOR-CON M20 20 MEQ tablet Take 20 mEq by mouth daily.     Marland Kitchen LORazepam (ATIVAN) 2 MG tablet Take 1 mg by mouth at bedtime.     . metoprolol succinate (TOPROL-XL) 50 MG 24 hr tablet Take 1 tablet (50 mg total) by mouth daily. Take with or immediately following a meal. 90 tablet 3  . Polyethyl Glycol-Propyl Glycol (SYSTANE ULTRA OP) Apply 1 drop to eye 3 (three) times daily.    . verapamil (VERELAN PM) 240 MG 24 hr capsule Take 240 mg by mouth at bedtime.    Marland Kitchen  losartan-hydrochlorothiazide (HYZAAR) 100-12.5 MG tablet Take 1 tablet by mouth daily.    . furosemide (LASIX) 20 MG tablet Take 1 tablet (20 mg total) by mouth daily. 90 tablet 3  . losartan (COZAAR) 100 MG tablet Take 1 tablet (100 mg total) by mouth daily. 90 tablet 3   No facility-administered medications prior to visit.     Allergies:   Tricyclic antidepressants   Social History   Socioeconomic History  . Marital status: Married    Spouse name: Not on file  . Number of children: Not on file  . Years of education: Not on file  . Highest education level: Not on file  Occupational History  . Not on file  Tobacco Use  . Smoking status: Never Smoker  . Smokeless tobacco: Never Used  Vaping Use  . Vaping Use: Never used  Substance and Sexual Activity  . Alcohol use: Yes    Comment: Social  . Drug use: No  . Sexual activity: Never    Birth control/protection: Post-menopausal  Other Topics Concern  . Not on file  Social History Narrative  . Not on file   Social Determinants of Health   Financial Resource Strain:   . Difficulty of Paying Living Expenses: Not on file  Food Insecurity:   . Worried About Charity fundraiser in the Last Year: Not on file  . Ran Out of Food in the Last Year: Not on file  Transportation Needs:   . Lack of Transportation (Medical): Not on file  . Lack of Transportation (Non-Medical): Not on file  Physical Activity:   . Days of Exercise per Week: Not on file  . Minutes of Exercise per Session: Not on file  Stress:   . Feeling of Stress : Not on file  Social Connections:   . Frequency of Communication with Friends and Family: Not on file  . Frequency of Social Gatherings with Friends and Family: Not on file  . Attends Religious Services: Not on file  . Active Member of Clubs or Organizations: Not on file  . Attends Archivist Meetings: Not on file  . Marital Status: Not on file     Family History:  The patient's family history  includes Alzheimer's disease in her maternal grandfather and mother; Dementia in her mother; Diabetes in her maternal grandmother; Healthy in her son; Heart disease in her father; Hypertension in her paternal grandfather; Stroke in her paternal grandfather; Tuberculosis in her maternal grandmother.   Review of Systems:   Please see the history of present illness.     General:  No chills, fever, night sweats or weight changes.  Cardiovascular:  No chest pain, dyspnea on exertion, orthopnea, paroxysmal nocturnal dyspnea. Positive for palpitations and edema (improved).  Dermatological: No rash, lesions/masses Respiratory: No cough, dyspnea Urologic: No hematuria, dysuria Abdominal:  No nausea, vomiting, diarrhea, bright red blood per rectum, melena, or hematemesis Neurologic:  No visual changes, wkns, changes in mental status. All other systems reviewed and are otherwise negative except as noted above.   Physical Exam:    VS:  BP (!) 128/58   Pulse 85   Ht 5' (1.524 m)   Wt 137 lb 12.8 oz (62.5 kg)   SpO2 97%   BMI 26.91 kg/m    General: Pleasant, elderly female appearing in no acute distress. Head: Normocephalic, atraumatic. Neck: No carotid bruits. JVD not elevated.  Lungs: Respirations regular and unlabored, without wheezes or rales.  Heart: Regular rate and rhythm. No S3 or S4.  No murmur, no rubs, or gallops appreciated. Abdomen: Appears non-distended. No obvious abdominal masses. Msk:  Strength and tone appear normal for age. No obvious joint deformities or effusions. Extremities: No clubbing or cyanosis. Trace ankle edema bilatrally.  Distal pedal pulses are 2+ bilaterally. Neuro: Alert and oriented X 3. Moves all extremities spontaneously. No focal deficits noted. Psych:  Responds to questions appropriately with a normal affect. Skin: No rashes or lesions noted  Wt Readings from Last 3 Encounters:  09/24/20 137 lb 12.8 oz (62.5 kg)  09/09/20 137 lb 9.6 oz (62.4 kg)    08/01/20 135 lb 9.6 oz (61.5 kg)    Studies/Labs Reviewed:   EKG:  EKG is not ordered today.    Recent Labs: 08/01/2020: ALT 15; TSH 1.667 09/09/2020: BUN 15; Creatinine, Ser 0.80; Magnesium 1.9; Potassium 3.8; Sodium 137   Lipid Panel No results found for: CHOL, TRIG, HDL, CHOLHDL, VLDL, LDLCALC, LDLDIRECT  Additional studies/ records that were reviewed today include:   Echocardiogram: 02/2020 IMPRESSIONS    1. Left ventricular ejection fraction, by estimation, is 60 to 65%. The  left ventricle has normal function. The left ventricle has no regional  wall motion abnormalities. Left ventricular diastolic parameters are  consistent with Grade I diastolic  dysfunction (impaired relaxation). Elevated left atrial pressure.  2. Right ventricular systolic function is normal. The right ventricular  size is normal.  3. The mitral valve is normal in structure. Trivial mitral valve  regurgitation. No evidence of mitral stenosis.  4. The aortic valve is tricuspid. Aortic valve regurgitation is not  visualized. No aortic stenosis is present.  5. The inferior vena cava is normal in size with greater than 50%  respiratory variability, suggesting right atrial pressure of 3 mmHg.    NST: 02/2020  There was no ST segment deviation noted during stress.  The study is normal. There are no perfusion defects.  This is a low risk study.  The left ventricular ejection fraction is hyperdynamic (>65%).   Event Monitor: 07/2020 [employer] monitor reviewed.  30 days analyzed.  Predominant rhythm is sinus with heart rate ranging from 41 bpm up to 118 bpm and average heart rate 66 bpm.  Slowest heart rates corresponded to junctional rhythm.  There were no pauses.  Rare PVC noted.  Also lead motion artifact.  No sustained arrhythmias.   Assessment:    1. Essential hypertension   2. Hyponatremia   3. Palpitations   4. Lower extremity edema      Plan:   In order of problems listed  above:  1. HTN - BP is well-controlled at 128/58 during today's visit and has overall been well-controled at home. Continue current medication regimen with Hydralazine 50mg  BID, Losartan-HCTZ 100-12.5mg  daily, Toprol-XL 50mg  daily and Verapamil CR 240mg  daily.   2. Hyponatremia - Na+  was at 125 in 07/2020, normalized to 137 by repeat labs on 09/09/2020. Given that she has resumed HCTZ, will recheck a BMET in 2 weeks. If she has developed recurrent hyponatremia, will likely need to stop HCTZ and try Lasix at a lower dose of 10mg  every day or every other day as she previously did not tolerate 20mg  daily.   3. Palpitations - Recent monitor showed predominately sinus rhythm with episodes of junctional rhythm and HR ranging from 41 bpm to 118 bpm and an average of 66 bpm. Her Toprol-XL was reduced to 50mg  daily. HR has been well-controlled in the 60's to 70's when checked at home. Remains on Toprol-XL 50mg  daily and Verapamil CR 240mg  daily.   4. Lower Extremity Edema - She reports symptoms resolved with resumption of HCTZ. Did not tolerate daily Lasix due to frequent urination and progressive dizziness and fatigue. Remains on HCTZ 12.5mg  daily. Will recheck BMET.    She wishes to follow-up with Cardiology on a PRN basis. She does plan to move to [place] in the future as her son lives there.    Medication Adjustments/Labs and Tests Ordered: Current medicines are reviewed at length with the patient today.  Concerns regarding medicines are outlined above.  Medication changes, Labs and Tests ordered today are listed in the Patient Instructions below. Patient Instructions  Medication Instructions:  Your physician recommends that you continue on your current medications as directed. Please refer to the Current Medication list given to you today.  *If you need a refill on your cardiac medications before your next appointment, please call your pharmacy*   Lab Work: Your physician recommends that you  return for lab work in: 2 Weeks   If you have labs (blood work) drawn today and your tests are completely normal, you will receive your results only by: Marland Kitchen MyChart Message (if you have MyChart) OR . A paper copy in the mail If you have any lab test that is abnormal or we need to change your treatment, we will call you to review the results.   Testing/Procedures: NONE    Follow-Up: At [facility], you and your health needs are our priority.  As part of our continuing mission to provide you with exceptional heart care, we have created designated Provider Care Teams.  These Care Teams include your primary Cardiologist (physician) and Advanced Practice Providers (APPs -  Physician Assistants and Nurse Practitioners) who all work together to provide you with the care you need, when you need it.  We recommend signing up for the patient portal called "MyChart".  Sign up information is provided on this After Visit Summary.  MyChart is used to connect with patients for Virtual Visits (Telemedicine).  Patients are able to view lab/test results, encounter notes, upcoming appointments, etc.  Non-urgent messages can be sent to your provider as well.   To learn more about what you can do with MyChart, go to [email or web address].    Your next appointment:    As Needed   The format for your next appointment:   In Person  Provider:   You may see Rozann Lesches, MD or one of the following Advanced Practice Providers on your designated Care Team:    Bernerd Pho, PA-C   Ermalinda Barrios, PA-C     Other Instructions Thank you for choosing [facility]!       Signed, Erma Heritage, PA-C  09/24/2020 7:27 PM    [address]  [place], [place] [address] Phone: [phone] Fax: [phone]

## 2020-09-24 NOTE — Patient Instructions (Signed)
Medication Instructions:  Your physician recommends that you continue on your current medications as directed. Please refer to the Current Medication list given to you today.  *If you need a refill on your cardiac medications before your next appointment, please call your pharmacy*   Lab Work: Your physician recommends that you return for lab work in: 2 Weeks   If you have labs (blood work) drawn today and your tests are completely normal, you will receive your results only by: Marland Kitchen MyChart Message (if you have MyChart) OR . A paper copy in the mail If you have any lab test that is abnormal or we need to change your treatment, we will call you to review the results.   Testing/Procedures: NONE    Follow-Up: At [facility], you and your health needs are our priority.  As part of our continuing mission to provide you with exceptional heart care, we have created designated Provider Care Teams.  These Care Teams include your primary Cardiologist (physician) and Advanced Practice Providers (APPs -  Physician Assistants and Nurse Practitioners) who all work together to provide you with the care you need, when you need it.  We recommend signing up for the patient portal called "MyChart".  Sign up information is provided on this After Visit Summary.  MyChart is used to connect with patients for Virtual Visits (Telemedicine).  Patients are able to view lab/test results, encounter notes, upcoming appointments, etc.  Non-urgent messages can be sent to your provider as well.   To learn more about what you can do with MyChart, go to [email or web address].    Your next appointment:    As Needed   The format for your next appointment:   In Person  Provider:   You may see Rozann Lesches, MD or one of the following Advanced Practice Providers on your designated Care Team:    Bernerd Pho, PA-C   Ermalinda Barrios, PA-C     Other Instructions Thank you for choosing [facility]!

## 2020-10-08 ENCOUNTER — Other Ambulatory Visit (HOSPITAL_COMMUNITY)
Admission: RE | Admit: 2020-10-08 | Discharge: 2020-10-08 | Disposition: A | Discharge status: Home / Self Care | Payer: Medicare HMO | Source: Ambulatory Visit | Attending: Student | Admitting: Student

## 2020-10-08 DIAGNOSIS — E871 Hypo-osmolality and hyponatremia: Secondary | ICD-10-CM | POA: Insufficient documentation

## 2020-10-08 LAB — BASIC METABOLIC PANEL
Anion gap: 10 (ref 5–15)
BUN: 20 mg/dL (ref 8–23)
CO2: 26 mmol/L (ref 22–32)
Calcium: 9 mg/dL (ref 8.9–10.3)
Chloride: 92 mmol/L — ABNORMAL LOW (ref 98–111)
Creatinine, Ser: 0.82 mg/dL (ref 0.44–1.00)
GFR, Estimated: >60 mL/min (ref >60)
Glucose, Bld: 110 mg/dL — ABNORMAL HIGH (ref 70–99)
Potassium: 3.8 mmol/L (ref 3.5–5.1)
Sodium: 128 mmol/L — ABNORMAL LOW (ref 135–145)

## 2020-10-09 ENCOUNTER — Telehealth: Payer: Self-pay | Admitting: Student

## 2020-10-09 DIAGNOSIS — Z79899 Other long term (current) drug therapy: Secondary | ICD-10-CM

## 2020-10-09 MED ORDER — FUROSEMIDE 20 MG PO TABS: 10.0000 mg | ORAL_TABLET | Freq: Every day | ORAL | 3 refills | Status: DC

## 2020-10-09 MED ORDER — LOSARTAN POTASSIUM 100 MG PO TABS: 100.0000 mg | ORAL_TABLET | Freq: Every day | ORAL | 3 refills | Status: DC

## 2020-10-09 NOTE — Telephone Encounter (Signed)
Pt notified and orders placed  

## 2020-10-09 NOTE — Telephone Encounter (Signed)
    At the time of her last visit on 09/24/2020 she told me she had self discontinued Lasix 20 mg daily and restarted Losartan-HCTZ on her own. She did have repeat labs yesterday which showed her kidney function remains stable but her sodium was again low at 128 and similar to prior values from 2 months ago. This had previously normalized when off of HCTZ, therefore I think HCTZ is the cause of her low sodium (hyponatremia) and she needs to stop it for now. I would recommend she stop Losartan-HCTZ and restart Losartan at 100 mg daily. For her swelling, she was previously intolerant to Lasix 20 mg daily, therefore I would recommend that she try taking 10 mg every day and if intolerant to this can go to 10 mg every other day. Would recommend repeat labs in 3 to 4 weeks for reassessment once off HCTZ to make sure her Na+ level has normalized.   Signed, Erma Heritage, PA-C 10/09/2020, 12:41 PM Pager: [phone]

## 2020-10-09 NOTE — Telephone Encounter (Signed)
Spoke with pt who states that she has had increased swelling to the feet and ankles over the last week. She denies SOB at this time, but does c/o being fatigue. She reports gaining 5 lbs in the last week. Pt ask that she be restarted on losartan-HCTZ 100-25mg .

## 2020-10-09 NOTE — Telephone Encounter (Signed)
New message    Pt c/o swelling: STAT is pt has developed SOB within 24 hours  1) How much weight have you gained and in what time span? A couple pounds   2) If swelling, where is the swelling located? Legs and ankles   3) Are you currently taking a fluid pill? yes  4) Are you currently SOB? no  Do you have a log of your daily weights (if so, list)? 136yesterday - 140 today  5) Have you gained 3 pounds in a day or 5 pounds in a week?   6) Have you traveled recently? no

## 2020-10-30 ENCOUNTER — Other Ambulatory Visit (HOSPITAL_COMMUNITY)
Admission: RE | Admit: 2020-10-30 | Discharge: 2020-10-30 | Disposition: A | Discharge status: Home / Self Care | Payer: Medicare HMO | Source: Ambulatory Visit | Attending: Student | Admitting: Student

## 2020-10-30 ENCOUNTER — Other Ambulatory Visit: Payer: Self-pay

## 2020-10-30 DIAGNOSIS — Z79899 Other long term (current) drug therapy: Secondary | ICD-10-CM | POA: Insufficient documentation

## 2020-10-30 LAB — BASIC METABOLIC PANEL
Anion gap: 7 (ref 5–15)
BUN: 13 mg/dL (ref 8–23)
CO2: 26 mmol/L (ref 22–32)
Calcium: 8.8 mg/dL — ABNORMAL LOW (ref 8.9–10.3)
Chloride: 97 mmol/L — ABNORMAL LOW (ref 98–111)
Creatinine, Ser: 0.8 mg/dL (ref 0.44–1.00)
GFR, Estimated: >60 mL/min (ref >60)
Glucose, Bld: 101 mg/dL — ABNORMAL HIGH (ref 70–99)
Potassium: 3.8 mmol/L (ref 3.5–5.1)
Sodium: 130 mmol/L — ABNORMAL LOW (ref 135–145)

## 2022-09-01 ENCOUNTER — Other Ambulatory Visit (HOSPITAL_COMMUNITY): Payer: Self-pay | Admitting: Internal Medicine

## 2022-09-01 DIAGNOSIS — R221 Localized swelling, mass and lump, neck: Secondary | ICD-10-CM

## 2022-09-14 ENCOUNTER — Ambulatory Visit (HOSPITAL_COMMUNITY)
Admission: RE | Admit: 2022-09-14 | Discharge: 2022-09-14 | Disposition: A | Discharge status: Home / Self Care | Payer: Medicare HMO | Source: Ambulatory Visit | Attending: Internal Medicine | Admitting: Internal Medicine

## 2022-09-14 DIAGNOSIS — R221 Localized swelling, mass and lump, neck: Secondary | ICD-10-CM | POA: Diagnosis present

## 2022-09-17 ENCOUNTER — Other Ambulatory Visit (HOSPITAL_COMMUNITY): Payer: Self-pay | Admitting: Internal Medicine

## 2022-09-17 DIAGNOSIS — D37032 Neoplasm of uncertain behavior of the submandibular salivary glands: Secondary | ICD-10-CM

## 2022-10-02 ENCOUNTER — Ambulatory Visit (HOSPITAL_COMMUNITY)
Admission: RE | Admit: 2022-10-02 | Discharge: 2022-10-02 | Disposition: A | Discharge status: Home / Self Care | Payer: Medicare HMO | Source: Ambulatory Visit | Attending: Internal Medicine | Admitting: Internal Medicine

## 2022-10-02 DIAGNOSIS — D37032 Neoplasm of uncertain behavior of the submandibular salivary glands: Secondary | ICD-10-CM | POA: Insufficient documentation

## 2022-10-02 MED ORDER — IOHEXOL 300 MG/ML  SOLN
75.0000 mL | Freq: Once | INTRAMUSCULAR | Status: AC | PRN
  Administered 2022-10-02: 75 mL via INTRAVENOUS

## 2022-10-28 ENCOUNTER — Other Ambulatory Visit (HOSPITAL_COMMUNITY): Payer: Self-pay | Admitting: Otolaryngology

## 2022-10-28 ENCOUNTER — Encounter: Payer: Self-pay | Admitting: General Practice

## 2022-10-28 ENCOUNTER — Encounter (HOSPITAL_COMMUNITY): Payer: Self-pay | Admitting: Otolaryngology

## 2022-10-28 DIAGNOSIS — R220 Localized swelling, mass and lump, head: Secondary | ICD-10-CM

## 2022-10-28 NOTE — Progress Notes (Signed)
Michaelle Birks, MD  Allen Kell, NT; P Ir Procedure Requests BIOPSY REVIEW Date: 10/28/22  Requested Biopsy site: R SM gland Reason for request: Mass  Imaging review: I reviewed all pertinent diagnostic studies, including; US neck, 09/14/22 and CT Neck W, 10/02/22  Recommended imaging modality to perform biopsy: Ultrasound Additional comments: '@Performing'$  Doc. R SM gland mass, best appreciated on [place] '@Schedulers'$ . Local sedation only. Pt on ASA, no need to hold.  Please contact me with questions, concerns, or if issue pertaining to this request arise.  Michaelle Birks, MD [facility]  Pager. [phone] Clinic. [phone]       Previous Messages    ----- Message ----- From: Allen Kell, NT Sent: 10/28/2022  10:56 AM EST To: Ir Procedure Requests Subject: [place] CORE BIOPSY (SOFT TISSUE)                  Procedure: [place] CORE BIOPSY (SOFT TISSUE)  Reason: Mass of right submandibular region  History: [place] and CT in chart  Provider: Jenetta Downer, MD  Contact: [phone]

## 2022-11-25 ENCOUNTER — Other Ambulatory Visit: Payer: Self-pay | Admitting: Radiology

## 2022-11-25 NOTE — H&P (Incomplete)
Chief Complaint: Patient was seen in consultation today for right submandibular mass biopsy   at the request of Hoshal,Steven  Referring Physician(s): Hoshal,Steven  Supervising Physician: Mir, Sharen Heck  Patient Status: [facility] - Out-pt  History of Present Illness: [name] is a 89 y.o. female followed by [facility] ENT has been referred to IR for ultrasound-guided FNA of right submandibular mass.  Procedure was reviewed and approved by Dr. Maryelizabeth Kaufmann.  Past Medical History:  Diagnosis Date   Anxiety    Essential hypertension    GERD (gastroesophageal reflux disease)    Hyperlipidemia    Palpitations    Reportedly symptomatic PVCs    Spinal stenosis    Vaginal atrophy 04/18/2015   Vaginal itching 04/18/2015    Past Surgical History:  Procedure Laterality Date   Bilateral breast cyst  1964   CATARACT EXTRACTION W/PHACO Right 03/02/2016   Procedure: CATARACT EXTRACTION PHACO AND INTRAOCULAR LENS PLACEMENT ([place]);  Surgeon: Tonny Branch, MD;  Location: [facility];  Service: Ophthalmology;  Laterality: Right;  CDE 10.57   Caturact Surgery  2010   CHOLECYSTECTOMY     04/07/2011   COLONOSCOPY     11/04/2010 Dr.Rourk   DILATION AND CURETTAGE OF UTERUS  1972   UPPER GASTROINTESTINAL ENDOSCOPY      Allergies: Tricyclic antidepressants  Medications: Prior to Admission medications   Medication Sig Start Date End Date Taking? Authorizing Provider  acetaminophen (TYLENOL) 500 MG tablet Take 1,000 mg by mouth as needed for mild pain or fever.    Yes [provider]  aspirin 81 MG tablet Take 81 mg by mouth daily.     Yes [provider]  chlorthalidone (HYGROTON) 25 MG tablet Take 25 mg by mouth every morning. 07/16/22  Yes [provider]  docusate sodium (COLACE) 100 MG capsule Take 100 mg by mouth daily as needed for mild constipation.   Yes [provider]  hydrALAZINE (APRESOLINE) 25 MG tablet Take 25 mg by mouth in the morning and at  bedtime.   Yes [provider]  LORazepam (ATIVAN) 1 MG tablet Take 1 mg by mouth at bedtime.   Yes [provider]  metoprolol succinate (TOPROL-XL) 50 MG 24 hr tablet Take 50-100 mg by mouth See admin instructions. 50 mg in the morning, 100 mg at bedtime. Take with or immediately following a meal.   Yes [provider]  pantoprazole (PROTONIX) 40 MG tablet Take 40 mg by mouth daily. 08/03/22  Yes [provider]  Polyethyl Glycol-Propyl Glycol (SYSTANE ULTRA OP) Place 1 drop into both eyes in the morning and at bedtime.   Yes [provider]  spironolactone (ALDACTONE) 25 MG tablet Take 25 mg by mouth daily. 08/20/22  Yes [provider]  traMADol (ULTRAM) 50 MG tablet Take 50 mg by mouth daily as needed for severe pain.   Yes [provider]  verapamil (CALAN-SR) 240 MG CR tablet Take 240 mg by mouth at bedtime.   Yes [provider]     Family History  Problem Relation Age of Onset   Healthy Son    Heart disease Father    Dementia Mother    Alzheimer's disease Mother    Tuberculosis Maternal Grandmother    Diabetes Maternal Grandmother    Alzheimer's disease Maternal Grandfather    Hypertension Paternal Grandfather    Stroke Paternal Grandfather     Social History   Socioeconomic History   Marital status: Married    Spouse name: Not  on file   Number of children: Not on file   Years of education: Not on file   Highest education level: Not on file  Occupational History   Not on file  Tobacco Use   Smoking status: Never   Smokeless tobacco: Never  Vaping Use   Vaping Use: Never used  Substance and Sexual Activity   Alcohol use: Yes    Comment: Social   Drug use: No   Sexual activity: Never    Birth control/protection: Post-menopausal  Other Topics Concern   Not on file  Social History Narrative   Not on file   Social Determinants of Health   Financial Resource Strain: Not on file  Food  Insecurity: Not on file  Transportation Needs: Not on file  Physical Activity: Not on file  Stress: Not on file  Social Connections: Not on file    Review of Systems: A 12 point ROS discussed and pertinent positives are indicated in the HPI above.  All other systems are negative.  Review of Systems  Vital Signs: There were no vitals taken for this visit.    Physical Exam  Imaging: No results found.  Labs:  CBC: No results for input(s): "WBC", "HGB", "HCT", "PLT" in the last 8760 hours.  COAGS: No results for input(s): "INR", "APTT" in the last 8760 hours.  BMP: No results for input(s): "NA", "K", "CL", "CO2", "GLUCOSE", "BUN", "CALCIUM", "CREATININE", "GFRNONAA", "GFRAA" in the last 8760 hours.  Invalid input(s): "CMP"  LIVER FUNCTION TESTS: No results for input(s): "BILITOT", "AST", "ALT", "ALKPHOS", "PROT", "ALBUMIN" in the last 8760 hours.  TUMOR MARKERS: No results for input(s): "AFPTM", "CEA", "CA199", "CHROMGRNA" in the last 8760 hours.  Assessment and Plan: 89 year old female presents to IR for FNA of right submandibular mass.  Risks and benefits of right submandibular mass biopsy was discussed with the patient and/or patient's family including, but not limited to bleeding, infection, damage to adjacent structures or low yield requiring additional tests.  All of the questions were answered and there is agreement to proceed.  Consent signed and in chart.  Thank you for this interesting consult.  I greatly enjoyed meeting [name] and look forward to participating in their care.  A copy of this report was sent to the requesting provider on this date.  Electronically Signed: Tyson Alias, NP 11/25/2022, 2:53 PM   I spent a total of {New LYHT:[id number]} {New Out-Pt:[id number]}  {Established Out-Pt:304952003} in face to face in clinical consultation, greater than 50% of which was counseling/coordinating care for right submandibular mass.

## 2022-11-26 ENCOUNTER — Ambulatory Visit (HOSPITAL_COMMUNITY)
Admission: RE | Admit: 2022-11-26 | Discharge: 2022-11-26 | Disposition: A | Discharge status: Home / Self Care | Payer: Medicare HMO | Source: Ambulatory Visit | Attending: Otolaryngology | Admitting: Otolaryngology

## 2022-11-26 ENCOUNTER — Other Ambulatory Visit: Payer: Self-pay

## 2022-11-26 DIAGNOSIS — I898 Other specified noninfective disorders of lymphatic vessels and lymph nodes: Secondary | ICD-10-CM | POA: Diagnosis not present

## 2022-11-26 DIAGNOSIS — R221 Localized swelling, mass and lump, neck: Secondary | ICD-10-CM | POA: Insufficient documentation

## 2022-11-26 DIAGNOSIS — R220 Localized swelling, mass and lump, head: Secondary | ICD-10-CM

## 2022-11-26 MED ORDER — SODIUM CHLORIDE 0.9 % IV SOLN: INTRAVENOUS | Status: DC

## 2022-11-26 MED ORDER — LIDOCAINE HCL (PF) 1 % IJ SOLN: 2.0000 mL | Freq: Once | INTRAMUSCULAR | Status: DC

## 2022-11-26 MED ORDER — LIDOCAINE HCL (PF) 1 % IJ SOLN
INTRAMUSCULAR | Status: AC
  Filled 2022-11-26: qty 30

## 2022-11-26 NOTE — Procedures (Signed)
Interventional Radiology Procedure Note  Procedure: US guided biopsy of right submandibular mass  Indication: Enlarging Submandiublar mass   Findings: Please refer to procedural dictation for full description.  Complications: None  EBL: < 10 mL  Miachel Roux, MD [phone]

## 2022-11-26 NOTE — Progress Notes (Signed)
Pt referred to IR by [facility] ENT for right submandibular biopsy. Pt was setup to arrive in SS for procedural moderate sedation. After speaking with patient, she is agreeable to proceeding today with local anesthetic only since her mass is superficial and palpable. Risks were discussed and patient's questions were answered.  Consent signed and in chart.   No IV/labs needed.  Dr. Dwaine Gale aware that patient is agreeable to local only.     Narda Rutherford, AGNP-BC 11/26/2022, 11:11 AM

## 2022-11-27 LAB — SURGICAL PATHOLOGY

## 2023-06-04 ENCOUNTER — Other Ambulatory Visit (HOSPITAL_COMMUNITY): Payer: Self-pay | Admitting: Internal Medicine

## 2023-06-04 DIAGNOSIS — Z13820 Encounter for screening for osteoporosis: Secondary | ICD-10-CM

## 2023-06-21 ENCOUNTER — Other Ambulatory Visit (HOSPITAL_COMMUNITY): Payer: Medicare HMO

## 2023-07-20 ENCOUNTER — Ambulatory Visit (HOSPITAL_COMMUNITY)
Admission: RE | Admit: 2023-07-20 | Discharge: 2023-07-20 | Disposition: A | Discharge status: Home / Self Care | Payer: Medicare HMO | Source: Ambulatory Visit | Attending: Internal Medicine | Admitting: Internal Medicine

## 2023-07-20 DIAGNOSIS — Z13820 Encounter for screening for osteoporosis: Secondary | ICD-10-CM | POA: Diagnosis not present

## 2023-07-20 DIAGNOSIS — Z780 Asymptomatic menopausal state: Secondary | ICD-10-CM | POA: Diagnosis not present

## 2023-07-20 DIAGNOSIS — M810 Age-related osteoporosis without current pathological fracture: Secondary | ICD-10-CM | POA: Insufficient documentation

## 2024-12-26 ENCOUNTER — Other Ambulatory Visit (HOSPITAL_COMMUNITY): Payer: Self-pay | Admitting: *Deleted

## 2024-12-26 ENCOUNTER — Emergency Department (HOSPITAL_COMMUNITY)

## 2024-12-26 ENCOUNTER — Other Ambulatory Visit: Payer: Self-pay

## 2024-12-26 ENCOUNTER — Observation Stay (HOSPITAL_COMMUNITY)

## 2024-12-26 ENCOUNTER — Encounter (HOSPITAL_COMMUNITY): Payer: Self-pay | Admitting: Emergency Medicine

## 2024-12-26 ENCOUNTER — Observation Stay (HOSPITAL_COMMUNITY)
Admission: EM | Admit: 2024-12-26 | Discharge: 2024-12-29 | Disposition: A | Source: Home / Self Care | Attending: Internal Medicine | Admitting: Internal Medicine

## 2024-12-26 DIAGNOSIS — E860 Dehydration: Secondary | ICD-10-CM

## 2024-12-26 DIAGNOSIS — I639 Cerebral infarction, unspecified: Principal | ICD-10-CM | POA: Diagnosis present

## 2024-12-26 DIAGNOSIS — E875 Hyperkalemia: Secondary | ICD-10-CM

## 2024-12-26 DIAGNOSIS — R197 Diarrhea, unspecified: Secondary | ICD-10-CM

## 2024-12-26 DIAGNOSIS — E871 Hypo-osmolality and hyponatremia: Secondary | ICD-10-CM

## 2024-12-26 DIAGNOSIS — N179 Acute kidney failure, unspecified: Secondary | ICD-10-CM

## 2024-12-26 HISTORY — DX: Other diseases of salivary glands: K11.8

## 2024-12-26 LAB — COMPREHENSIVE METABOLIC PANEL WITH GFR
ALT: 10 U/L (ref 0–44)
AST: 17 U/L (ref 15–41)
Albumin: 4 g/dL (ref 3.5–5.0)
Alkaline Phosphatase: 74 U/L (ref 38–126)
Anion gap: 12 (ref 5–15)
BUN: 30 mg/dL — ABNORMAL HIGH (ref 8–23)
CO2: 19 mmol/L — ABNORMAL LOW (ref 22–32)
Calcium: 9.6 mg/dL (ref 8.9–10.3)
Chloride: 98 mmol/L (ref 98–111)
Creatinine, Ser: 1.55 mg/dL — ABNORMAL HIGH (ref 0.44–1.00)
GFR, Estimated: 31 mL/min — ABNORMAL LOW
Glucose, Bld: 106 mg/dL — ABNORMAL HIGH (ref 70–99)
Potassium: 5.4 mmol/L — ABNORMAL HIGH (ref 3.5–5.1)
Sodium: 130 mmol/L — ABNORMAL LOW (ref 135–145)
Total Bilirubin: 0.4 mg/dL (ref 0.0–1.2)
Total Protein: 6.8 g/dL (ref 6.5–8.1)

## 2024-12-26 LAB — CBC WITH DIFFERENTIAL/PLATELET
Abs Immature Granulocytes: 0.04 10*3/uL (ref 0.00–0.07)
Basophils Absolute: 0.1 10*3/uL (ref 0.0–0.1)
Basophils Relative: 1 %
Eosinophils Absolute: 0.1 10*3/uL (ref 0.0–0.5)
Eosinophils Relative: 1 %
HCT: 34.6 % — ABNORMAL LOW (ref 36.0–46.0)
Hemoglobin: 11.5 g/dL — ABNORMAL LOW (ref 12.0–15.0)
Immature Granulocytes: 1 %
Lymphocytes Relative: 16 %
Lymphs Abs: 1.1 10*3/uL (ref 0.7–4.0)
MCH: 31.9 pg (ref 26.0–34.0)
MCHC: 33.2 g/dL (ref 30.0–36.0)
MCV: 95.8 fL (ref 80.0–100.0)
Monocytes Absolute: 0.7 10*3/uL (ref 0.1–1.0)
Monocytes Relative: 10 %
Neutro Abs: 4.8 10*3/uL (ref 1.7–7.7)
Neutrophils Relative %: 71 %
Platelets: 211 10*3/uL (ref 150–400)
RBC: 3.61 MIL/uL — ABNORMAL LOW (ref 3.87–5.11)
RDW: 12.6 % (ref 11.5–15.5)
WBC: 6.7 10*3/uL (ref 4.0–10.5)
nRBC: 0 % (ref 0.0–0.2)

## 2024-12-26 LAB — URINALYSIS, ROUTINE W REFLEX MICROSCOPIC
Bacteria, UA: NONE SEEN
Bilirubin Urine: NEGATIVE
Glucose, UA: NEGATIVE mg/dL
Ketones, ur: NEGATIVE mg/dL
Nitrite: NEGATIVE
Protein, ur: 100 mg/dL — AB
Specific Gravity, Urine: 1.004 — ABNORMAL LOW (ref 1.005–1.030)
pH: 6 (ref 5.0–8.0)

## 2024-12-26 LAB — RESP PANEL BY RT-PCR (RSV, FLU A&B, COVID)  RVPGX2
Influenza A by PCR: NEGATIVE
Influenza B by PCR: NEGATIVE
Resp Syncytial Virus by PCR: NEGATIVE
SARS Coronavirus 2 by RT PCR: NEGATIVE

## 2024-12-26 LAB — LIPASE, BLOOD: Lipase: 23 U/L (ref 11–51)

## 2024-12-26 LAB — TSH: TSH: 3.09 u[IU]/mL (ref 0.350–4.500)

## 2024-12-26 LAB — VITAMIN B12: Vitamin B-12: 662 pg/mL (ref 180–914)

## 2024-12-26 LAB — MAGNESIUM: Magnesium: 1.9 mg/dL (ref 1.7–2.4)

## 2024-12-26 MED ORDER — HYDRALAZINE HCL 20 MG/ML IJ SOLN
5.0000 mg | Freq: Once | INTRAMUSCULAR | Status: AC
  Administered 2024-12-26: 5 mg via INTRAVENOUS
  Filled 2024-12-26: qty 1

## 2024-12-26 MED ORDER — LORAZEPAM 0.5 MG PO TABS
0.5000 mg | ORAL_TABLET | Freq: Three times a day (TID) | ORAL | Status: DC | PRN
  Administered 2024-12-26 – 2024-12-28 (×3): 0.5 mg via ORAL
  Filled 2024-12-26 (×3): qty 1

## 2024-12-26 MED ORDER — SODIUM CHLORIDE 0.9 % IV SOLN: INTRAVENOUS | Status: DC

## 2024-12-26 MED ORDER — PANTOPRAZOLE SODIUM 40 MG PO TBEC
40.0000 mg | DELAYED_RELEASE_TABLET | Freq: Every day | ORAL | Status: DC
  Administered 2024-12-26 – 2024-12-29 (×4): 40 mg via ORAL
  Filled 2024-12-26 (×4): qty 1

## 2024-12-26 MED ORDER — ACETAMINOPHEN 160 MG/5ML PO SOLN: 650.0000 mg | ORAL | Status: DC | PRN

## 2024-12-26 MED ORDER — SENNOSIDES-DOCUSATE SODIUM 8.6-50 MG PO TABS
1.0000 | ORAL_TABLET | Freq: Every evening | ORAL | Status: DC | PRN
  Administered 2024-12-29: 1 via ORAL
  Filled 2024-12-26: qty 1

## 2024-12-26 MED ORDER — HEPARIN SODIUM (PORCINE) 5000 UNIT/ML IJ SOLN
5000.0000 [IU] | Freq: Three times a day (TID) | INTRAMUSCULAR | Status: DC
  Administered 2024-12-26 – 2024-12-29 (×10): 5000 [IU] via SUBCUTANEOUS
  Filled 2024-12-26 (×10): qty 1

## 2024-12-26 MED ORDER — POLYVINYL ALCOHOL 1.4 % OP SOLN
1.0000 [drp] | Freq: Two times a day (BID) | OPHTHALMIC | Status: DC
  Administered 2024-12-26 – 2024-12-29 (×5): 1 [drp] via OPHTHALMIC
  Filled 2024-12-26 (×2): qty 15

## 2024-12-26 MED ORDER — STROKE: EARLY STAGES OF RECOVERY BOOK
Freq: Once | Status: AC
  Filled 2024-12-26 (×2): qty 1

## 2024-12-26 MED ORDER — LACTATED RINGERS IV BOLUS
1000.0000 mL | Freq: Once | INTRAVENOUS | Status: AC
  Administered 2024-12-26: 1000 mL via INTRAVENOUS

## 2024-12-26 MED ORDER — HYDRALAZINE HCL 20 MG/ML IJ SOLN: 10.0000 mg | Freq: Three times a day (TID) | INTRAMUSCULAR | Status: DC | PRN

## 2024-12-26 MED ORDER — CLOPIDOGREL BISULFATE 75 MG PO TABS
75.0000 mg | ORAL_TABLET | Freq: Every day | ORAL | Status: DC
  Administered 2024-12-26 – 2024-12-29 (×4): 75 mg via ORAL
  Filled 2024-12-26 (×4): qty 1

## 2024-12-26 MED ORDER — ASPIRIN 81 MG PO TBEC
81.0000 mg | DELAYED_RELEASE_TABLET | Freq: Every day | ORAL | Status: DC
  Administered 2024-12-26 – 2024-12-29 (×4): 81 mg via ORAL
  Filled 2024-12-26 (×4): qty 1

## 2024-12-26 MED ORDER — ACETAMINOPHEN 650 MG RE SUPP: 650.0000 mg | RECTAL | Status: DC | PRN

## 2024-12-26 MED ORDER — SODIUM ZIRCONIUM CYCLOSILICATE 5 G PO PACK
5.0000 g | PACK | Freq: Once | ORAL | Status: AC
  Administered 2024-12-26: 5 g via ORAL
  Filled 2024-12-26: qty 1

## 2024-12-26 MED ORDER — METOPROLOL SUCCINATE ER 50 MG PO TB24
100.0000 mg | ORAL_TABLET | Freq: Every day | ORAL | Status: DC
  Administered 2024-12-26: 100 mg via ORAL
  Filled 2024-12-26: qty 2

## 2024-12-26 MED ORDER — ACETAMINOPHEN 325 MG PO TABS
650.0000 mg | ORAL_TABLET | ORAL | Status: DC | PRN
  Administered 2024-12-26 – 2024-12-28 (×3): 650 mg via ORAL
  Filled 2024-12-26 (×3): qty 2

## 2024-12-26 NOTE — ED Triage Notes (Addendum)
 Called out for possible stroke, from home BIB EMS   stated negative on their stroke screen, Patient did have a fall yesterday from leg giving out, patient weakness last normal was yesterday at noon. Patient reports her speech is different, CBG 130, BP 180/108, 98% RA, 67 pulse.  Patient report she fell this morning, denies hitting her head, noted her voice has change recently and having weakness and diarrhea since Saturday, nausea.

## 2024-12-26 NOTE — ED Notes (Signed)
 Patient transported to CT

## 2024-12-26 NOTE — Plan of Care (Signed)
 Called by Dr. Cecilia at [facility] 89/F with new PCA strokes, low NIH (arm sensorimotor symptoms only). 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 At baseline, completely normal.   RONAL Lav, MD Neurology

## 2024-12-27 ENCOUNTER — Observation Stay (HOSPITAL_COMMUNITY)

## 2024-12-27 DIAGNOSIS — I6389 Other cerebral infarction: Secondary | ICD-10-CM | POA: Diagnosis not present

## 2024-12-27 DIAGNOSIS — R569 Unspecified convulsions: Secondary | ICD-10-CM | POA: Diagnosis not present

## 2024-12-27 DIAGNOSIS — I639 Cerebral infarction, unspecified: Secondary | ICD-10-CM | POA: Diagnosis not present

## 2024-12-27 LAB — HEMOGLOBIN A1C
Hgb A1c MFr Bld: 5.3 % (ref 4.8–5.6)
Mean Plasma Glucose: 105.41 mg/dL

## 2024-12-27 LAB — LIPID PANEL
Cholesterol: 172 mg/dL (ref 0–200)
HDL: 42 mg/dL
LDL Cholesterol: 116 mg/dL — ABNORMAL HIGH (ref 0–99)
Total CHOL/HDL Ratio: 4.1 ratio
Triglycerides: 66 mg/dL
VLDL: 13 mg/dL (ref 0–40)

## 2024-12-27 LAB — ECHOCARDIOGRAM COMPLETE
AR max vel: 2.02 cm2
AV Area VTI: 1.95 cm2
AV Area mean vel: 2.06 cm2
AV Mean grad: 3 mmHg
AV Peak grad: 6.1 mmHg
Ao pk vel: 1.23 m/s
Area-P 1/2: 4.6 cm2
Calc EF: 57.2 %
Height: 60 in
S' Lateral: 3.4 cm
Single Plane A2C EF: 52.2 %
Single Plane A4C EF: 62 %
Weight: 2222.24 [oz_av]

## 2024-12-27 LAB — CBC
HCT: 30.2 % — ABNORMAL LOW (ref 36.0–46.0)
Hemoglobin: 10.4 g/dL — ABNORMAL LOW (ref 12.0–15.0)
MCH: 32 pg (ref 26.0–34.0)
MCHC: 34.4 g/dL (ref 30.0–36.0)
MCV: 92.9 fL (ref 80.0–100.0)
Platelets: 203 10*3/uL (ref 150–400)
RBC: 3.25 MIL/uL — ABNORMAL LOW (ref 3.87–5.11)
RDW: 12.5 % (ref 11.5–15.5)
WBC: 5.5 10*3/uL (ref 4.0–10.5)
nRBC: 0 % (ref 0.0–0.2)

## 2024-12-27 LAB — BASIC METABOLIC PANEL WITH GFR
Anion gap: 6 (ref 5–15)
BUN: 22 mg/dL (ref 8–23)
CO2: 23 mmol/L (ref 22–32)
Calcium: 8.9 mg/dL (ref 8.9–10.3)
Chloride: 102 mmol/L (ref 98–111)
Creatinine, Ser: 1.18 mg/dL — ABNORMAL HIGH (ref 0.44–1.00)
GFR, Estimated: 43 mL/min — ABNORMAL LOW
Glucose, Bld: 86 mg/dL (ref 70–99)
Potassium: 5.4 mmol/L — ABNORMAL HIGH (ref 3.5–5.1)
Sodium: 131 mmol/L — ABNORMAL LOW (ref 135–145)

## 2024-12-27 MED ORDER — HYDRALAZINE HCL 20 MG/ML IJ SOLN: 5.0000 mg | Freq: Three times a day (TID) | INTRAMUSCULAR | Status: DC | PRN

## 2024-12-27 MED ORDER — HYDRALAZINE HCL 20 MG/ML IJ SOLN: 10.0000 mg | Freq: Three times a day (TID) | INTRAMUSCULAR | Status: DC | PRN

## 2024-12-27 MED ORDER — SODIUM ZIRCONIUM CYCLOSILICATE 10 G PO PACK
10.0000 g | PACK | Freq: Once | ORAL | Status: AC
  Administered 2024-12-27: 10 g via ORAL
  Filled 2024-12-27: qty 1

## 2024-12-27 MED ORDER — SPIRONOLACTONE 25 MG PO TABS
25.0000 mg | ORAL_TABLET | Freq: Every day | ORAL | Status: DC
  Administered 2024-12-27: 25 mg via ORAL
  Filled 2024-12-27: qty 1

## 2024-12-27 MED ORDER — METOPROLOL SUCCINATE ER 50 MG PO TB24
50.0000 mg | ORAL_TABLET | Freq: Every day | ORAL | Status: DC
  Administered 2024-12-27: 50 mg via ORAL
  Filled 2024-12-27 (×3): qty 1

## 2024-12-27 MED ORDER — HYDRALAZINE HCL 20 MG/ML IJ SOLN
5.0000 mg | Freq: Three times a day (TID) | INTRAMUSCULAR | Status: DC | PRN
  Administered 2024-12-27 – 2024-12-28 (×4): 5 mg via INTRAVENOUS
  Filled 2024-12-27 (×4): qty 1

## 2024-12-27 MED ORDER — ATORVASTATIN CALCIUM 40 MG PO TABS
40.0000 mg | ORAL_TABLET | Freq: Every day | ORAL | Status: DC
  Administered 2024-12-27 – 2024-12-29 (×3): 40 mg via ORAL
  Filled 2024-12-27 (×3): qty 1

## 2024-12-27 NOTE — Plan of Care (Signed)

## 2024-12-27 NOTE — Plan of Care (Signed)
 Family by her side all day. BP was in higher side so PRN meds given and informed to doctor.   Problem: Education: Goal: Knowledge of disease or condition will improve Outcome: Progressing   Problem: Ischemic Stroke/TIA Tissue Perfusion: Goal: Complications of ischemic stroke/TIA will be minimized Outcome: Progressing   Problem: Coping: Goal: Will verbalize positive feelings about self Outcome: Progressing   Problem: Health Behavior/Discharge Planning: Goal: Goals will be collaboratively established with patient/family Outcome: Progressing   Problem: Health Behavior/Discharge Planning: Goal: Ability to manage health-related needs will improve Outcome: Progressing   Problem: Self-Care: Goal: Ability to communicate needs accurately will improve Outcome: Progressing   Problem: Self-Care: Goal: Verbalization of feelings and concerns over difficulty with self-care will improve Outcome: Progressing   Problem: Nutrition: Goal: Risk of aspiration will decrease Outcome: Progressing   Problem: Nutrition: Goal: Dietary intake will improve Outcome: Progressing   Problem: Education: Goal: Knowledge of General Education information will improve Description: Including pain rating scale, medication(s)/side effects and non-pharmacologic comfort measures Outcome: Progressing   Problem: Skin Integrity: Goal: Risk for impaired skin integrity will decrease Outcome: Progressing   Problem: Safety: Goal: Ability to remain free from injury will improve Outcome: Progressing   Problem: Elimination: Goal: Will not experience complications related to bowel motility Outcome: Progressing   Problem: Coping: Goal: Level of anxiety will decrease Outcome: Progressing   Problem: Nutrition: Goal: Adequate nutrition will be maintained Outcome: Progressing   Problem: Activity: Goal: Risk for activity intolerance will decrease Outcome: Progressing   Problem: Clinical Measurements: Goal:  Will remain free from infection Outcome: Progressing   Problem: Clinical Measurements: Goal: Diagnostic test results will improve Outcome: Progressing

## 2024-12-27 NOTE — TOC Initial Note (Signed)
 Transition of Care [facility]) - Initial/Assessment Note    Patient Details  Name: [name] MRN: [id number] Date of Birth: 02/14/1931  [facility] CM/SW Contact:    Andrez JULIANNA George, RN Phone Number: 12/27/2024, 2:41 PM  Clinical Narrative:                 [name] is a 89 y.o. female with medical history significant of anxiety, hypertension, GERD, hyperlipidemia and hx of PVCs; who presented to the hospital secondary to left-sided weakness and no recent some difficulty finding words.  Pt is from home alone. She drives herself, manages her own medications and doesn't use any DME.  Current recommendations are for CIR. Pt feels she can arrange 24 hour caregivers after a rehab stay. She used caregivers with her late spouse and says she can arrange them for herself.  Awaiting CIR eval.   ICM following.  Expected Discharge Plan: IP Rehab Facility Barriers to Discharge: Continued Medical Work up   Patient Goals and CMS Choice   CMS Medicare.gov Compare Post Acute Care list provided to:: Patient Choice offered to / list presented to : Patient      Expected Discharge Plan and Services   Discharge Planning Services: CM Consult Post Acute Care Choice: IP Rehab Living arrangements for the past 2 months: Single Family Home                                      Prior Living Arrangements/Services Living arrangements for the past 2 months: Single Family Home Lives with:: Self Patient language and need for interpreter reviewed:: Yes Do you feel safe going back to the place where you live?: Yes          Current home services: DME (pt has old DME from her spouse: walker/ cane/ shower seat) Criminal Activity/Legal Involvement Pertinent to Current Situation/Hospitalization: No - Comment as needed  Activities of Daily Living      Permission Sought/Granted                  Emotional Assessment Appearance:: Appears stated  age Attitude/Demeanor/Rapport: Engaged Affect (typically observed): Accepting Orientation: : Oriented to Place, Oriented to Self, Oriented to  Time, Oriented to Situation   Psych Involvement: No (comment)  Admission diagnosis:  Hyperkalemia [E87.5] Hyponatremia [E87.1] Acute ischemic stroke [facility]) [I63.9] Acute cerebral infarction (HCC) [I63.9] AKI (acute kidney injury) [N17.9] Diarrhea, unspecified type [R19.7] Patient Active Problem List   Diagnosis Date Noted   Acute ischemic stroke (HCC) 12/26/2024   Vaginal itching 04/18/2015   Vaginal atrophy 04/18/2015   Essential hypertension 10/23/2014   Palpitations 10/23/2014   Symptomatic PVCs 10/23/2014   GERD 06/22/2008   PCP:  Sheryle Carwin, MD Pharmacy:   [facility] - [place], [place] - [address] [place] [address] Phone: [phone] Fax: [phone]     Social Drivers of Health (SDOH) Social History: SDOH Screenings   Food Insecurity: No Food Insecurity (12/26/2024)  Housing: High Risk (12/26/2024)  Transportation Needs: No Transportation Needs (12/26/2024)  Utilities: Not At Risk (12/26/2024)  Social Connections: Socially Isolated (12/26/2024)  Tobacco Use: Low Risk (12/26/2024)   SDOH Interventions:     Readmission Risk Interventions     No data to display

## 2024-12-27 NOTE — Progress Notes (Signed)
 Notified MD SBP was 208 and was treated with PRN Hydralazine  and brought down to 189. Notified Dr Madelyne and she did acknowledge but did not add any orders at this time and said they are allowing for premissive HTN. Patient is resting with the visitor at bedside.

## 2024-12-27 NOTE — Progress Notes (Signed)
 Attempted 2D echo at [phone], EEG in progress - will try again later per family member request.

## 2024-12-27 NOTE — Progress Notes (Signed)
 " PROGRESS NOTE    [name]  FMW:[id number] DOB: [birth year] DOA: 12/26/2024 PCP: Sheryle Carwin, MD   Brief Narrative: 89 year old with past medical history significant for anxiety hypertension GERD, hyperlipidemia history of PVC presented to the hospital secondary to right -sided weakness and some difficulty finding words.  Patient reports decreased oral intake and intermittent episode of diarrhea for the last 2 days.  Today of admission patient felt weak and fell, reports her leg just gave out.  Evaluation in the ED CT scan showed no acute intracranial abnormality.  Lab work consistent with AKI and hyperkalemia.  Chest x-ray no acute cardiopulmonary process, MRI showed acute PCA ischemic stroke.  Patient was transferred to [facility] for further evaluation.   Assessment & Plan:   Principal Problem:   Acute ischemic stroke (HCC)  1-Acute ischemic stroke -Presented with right side weakness and difficulty finding words - CT head: Brain atrophy and white matter microvascular ischemic changes - MRI brain: Acute infarct in the left thalamus and left temporal periventricular white matter.  Possible additional infarct in the left Hypaque-M post.  No evidence of acute large vessel occlusion. - MRI angio head: No large vessel occlusion, multiple moderate to severe stenosis right P2 segment, A3 segment.. 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 LDL 116, A1c 5.3 Currently on aspirin  and Plavix   Hypertension - Discussed with neurology no need for permissive hypertension.  Plan to resume lower dose metoprolol .  Will resume tomorrow amlodipine.  Will hold spironolactone  due to increased potassium Current hydralazine  as needed ordered  AKI: - Received IV fluid Presenting with a creatinine 1.5, down to 1.1 improved Continue to hold diuretics  Hyperkalemia: Received Lokelma   GERD: - Continue Protonix   History of Anxiety:  As needed  Xanax  Hyperglycemia: No consistent with diabetes, hemoglobin A1c 5.3 A1c: 5.3  Dehydration, Hyperkalemia, Hyponatremia:  Received  IV fluid       Estimated body mass index is 27.13 kg/m as calculated from the following:   Height as of this encounter: 5' (1.524 m).   Weight as of this encounter: 63 kg.   DVT prophylaxis: Heparin  Code Status: Full code Family Communication: Son who was at bedside Disposition Plan:  Status is: Observation The patient remains OBS appropriate and will d/c before 2 midnights.    Consultants:  Neurology  Procedures:  ECHO  Antimicrobials:    Subjective: Is alert, she is able to answer some questions, son at bedside thinks that speech is better and word finding difficulty has improved.  Patient is still having right-sided weakness  Objective: Vitals:   12/26/24 1956 12/26/24 2323 12/27/24 0437 12/27/24 0728  BP: (!) 155/68 (!) 174/68 (!) 164/64 (!) 180/76  Pulse: 80 80 70 69  Resp: 18 18 18 18   Temp: 98.5 F (36.9 C) 97.9 F (36.6 C) 97.9 F (36.6 C) 97.8 F (36.6 C)  TempSrc: Oral Oral Oral Oral  SpO2: 97% 98% 96% 97%  Weight:      Height:        Intake/Output Summary (Last 24 hours) at 12/27/2024 0917 Last data filed at 12/26/2024 2146 Gross per 24 hour  Intake 120 ml  Output 350 ml  Net -230 ml   Filed Weights   12/26/24 0819  Weight: 63 kg    Examination:  General exam: Appears calm and comfortable  Respiratory system: Clear to auscultation. Respiratory effort normal. Cardiovascular system: S1 & S2 heard, RRR. No JVD, murmurs,  rubs, gallops or clicks. No pedal edema. Gastrointestinal system: Abdomen is nondistended, soft and nontender. No organomegaly or masses felt. Normal bowel sounds heard. Central nervous system: Alert and oriented. Right LE 4.5/5   Data Reviewed: I have personally reviewed following labs and imaging studies  CBC: Recent Labs  Lab 12/26/24 0830 12/27/24 0156  WBC 6.7 5.5  NEUTROABS 4.8   --   HGB 11.5* 10.4*  HCT 34.6* 30.2*  MCV 95.8 92.9  PLT 211 203   Basic Metabolic Panel: Recent Labs  Lab 12/26/24 0830 12/27/24 0156  NA 130* 131*  K 5.4* 5.4*  CL 98 102  CO2 19* 23  GLUCOSE 106* 86  BUN 30* 22  CREATININE 1.55* 1.18*  CALCIUM  9.6 8.9  MG 1.9  --    GFR: Estimated Creatinine Clearance: 25.7 mL/min (A) (by C-G formula based on SCr of 1.18 mg/dL (H)). Liver Function Tests: Recent Labs  Lab 12/26/24 0830  AST 17  ALT 10  ALKPHOS 74  BILITOT 0.4  PROT 6.8  ALBUMIN 4.0   Recent Labs  Lab 12/26/24 0830  LIPASE 23   No results for input(s): AMMONIA in the last 168 hours. Coagulation Profile: No results for input(s): INR, PROTIME in the last 168 hours. Cardiac Enzymes: No results for input(s): CKTOTAL, CKMB, CKMBINDEX, TROPONINI in the last 168 hours. BNP (last 3 results) No results for input(s): PROBNP in the last 8760 hours. HbA1C: Recent Labs    12/27/24 0156  HGBA1C 5.3   CBG: No results for input(s): GLUCAP in the last 168 hours. Lipid Profile: Recent Labs    12/27/24 0156  CHOL 172  HDL 42  LDLCALC 116*  TRIG 66  CHOLHDL 4.1   Thyroid  Function Tests: Recent Labs    12/26/24 0830  TSH 3.090   Anemia Panel: Recent Labs    12/26/24 0830  VITAMINB12 662   Sepsis Labs: No results for input(s): PROCALCITON, LATICACIDVEN in the last 168 hours.  Recent Results (from the past 240 hours)  Resp panel by RT-PCR (RSV, Flu A&B, Covid) Anterior Nasal Swab     Status: None   Collection Time: 12/26/24  8:48 AM   Specimen: Anterior Nasal Swab  Result Value Ref Range Status   SARS Coronavirus 2 by RT PCR NEGATIVE NEGATIVE Final    Comment: (NOTE) SARS-CoV-2 target nucleic acids are NOT DETECTED.  The SARS-CoV-2 RNA is generally detectable in upper respiratory specimens during the acute phase of infection. The lowest concentration of SARS-CoV-2 viral copies this assay can detect is 138 copies/mL. A  negative result does not preclude SARS-Cov-2 infection and should not be used as the sole basis for treatment or other patient management decisions. A negative result may occur with  improper specimen collection/handling, submission of specimen other than nasopharyngeal swab, presence of viral mutation(s) within the areas targeted by this assay, and inadequate number of viral copies(<138 copies/mL). A negative result must be combined with clinical observations, patient history, and epidemiological information. The expected result is Negative.  Fact Sheet for Patients:  bloggercourse.com  Fact Sheet for Healthcare Providers:  seriousbroker.it  This test is no t yet approved or cleared by the [place]  FDA and  has been authorized for detection and/or diagnosis of SARS-CoV-2 by FDA under an Emergency Use Authorization (EUA). This EUA will remain  in effect (meaning this test can be used) for the duration of the COVID-19 declaration under Section 564(b)(1) of the Act, 21 U.S.C.section 360bbb-3(b)(1), unless the authorization is terminated  or revoked sooner.       Influenza A by PCR NEGATIVE NEGATIVE Final   Influenza B by PCR NEGATIVE NEGATIVE Final    Comment: (NOTE) The Xpert Xpress SARS-CoV-2/FLU/RSV plus assay is intended as an aid in the diagnosis of influenza from Nasopharyngeal swab specimens and should not be used as a sole basis for treatment. Nasal washings and aspirates are unacceptable for Xpert Xpress SARS-CoV-2/FLU/RSV testing.  Fact Sheet for Patients: bloggercourse.com  Fact Sheet for Healthcare Providers: seriousbroker.it  This test is not yet approved or cleared by the [place]  FDA and has been authorized for detection and/or diagnosis of SARS-CoV-2 by FDA under an Emergency Use Authorization (EUA). This EUA will remain in effect (meaning this test can  be used) for the duration of the COVID-19 declaration under Section 564(b)(1) of the Act, 21 U.S.C. section 360bbb-3(b)(1), unless the authorization is terminated or revoked.     Resp Syncytial Virus by PCR NEGATIVE NEGATIVE Final    Comment: (NOTE) Fact Sheet for Patients: bloggercourse.com  Fact Sheet for Healthcare Providers: seriousbroker.it  This test is not yet approved or cleared by the [place]  FDA and has been authorized for detection and/or diagnosis of SARS-CoV-2 by FDA under an Emergency Use Authorization (EUA). This EUA will remain in effect (meaning this test can be used) for the duration of the COVID-19 declaration under Section 564(b)(1) of the Act, 21 U.S.C. section 360bbb-3(b)(1), unless the authorization is terminated or revoked.  Performed at [facility], [address], [place], [place] [address]          Radiology Studies: MR ANGIO HEAD WO CONTRAST Result Date: 12/26/2024 EXAM: MRI BRAIN WITHOUT CONTRAST MRA HEAD WITHOUT CONTRAST 12/26/2024 11:44:27 AM TECHNIQUE: Multiplanar multisequence MRI of the brain was performed without the administration of intravenous contrast. MRA of the head was performed without contrast using time-of-flight technique. 3D postprocessing with multiplanar reformations and MIPs was performed for better evaluation of the vasculature. COMPARISON: Same day CT head. CLINICAL HISTORY: Neuro deficit, acute, stroke suspected. Acute neurological deficit; stroke suspected. FINDINGS: LIMITATIONS/ARTIFACTS: The MRI is somewhat limited by motion artifact. MRI BRAIN: BRAIN AND VENTRICLES: There is an 8 mm focus of restricted diffusion in the left thalamus compatible with acute infarct. Additional areas of restricted diffusion along the left hippocampus along with abnormal FLAIR signal intensity in this region; finding could reflect additional acute infarct versus postictal changes. Recommend  correlation with clinical history and consider correlation with EEG findings if clinically indicated. T2 and FLAIR hyperintensity in the periventricular and subcortical white matter suggestive of mild chronic microvascular ischemic changes. There is mild age-related parenchymal volume loss. Chronic microhemorrhage in the left occipital lobe. No acute intracranial hemorrhage. No mass. No midline shift. No hydrocephalus. The sella is unremarkable. Normal flow voids. ORBITS: There is a 0.8 x 0.5 cm mass along the superolateral preseptal soft tissues of the left orbit which was present in 2005 and appears minimally increased in size; favor benign etiology. Bilateral lens replacement. Slight irregular contour at the posterior aspect of both globes suggestive of axial myopia. SINUSES AND MASTOIDS: Mucous retention cyst in the right maxillary sinus. No significant abnormality in the mastoids. BONES AND SOFT TISSUES: Degenerative changes in the visualized upper cervical spine. There is at least mild spinal canal stenosis at the C4-C5 and C5-C6 levels. Normal bone marrow signal. No soft tissue abnormality. MRA HEAD: ANTERIOR CIRCULATION: The intracranial internal carotid arteries are patent bilaterally. There is mild irregularity of the carotid siphons likely related to  artifact as well as atherosclerosis. There is a 3 mm inferiorly directed outpouching along the left supraclinoid ICA concerning for small aneurysm at the origin of the posterior communicating artery. There is likely an additional 2.5 mm inferiorly directed outpouching at the left ICA terminus concerning for additional aneurysm. There is short segment occlusion of the A3 segment of the right ACA. There is reconstitution of the proximal right A4 segment. No significant stenosis of the anterior cerebral arteries (other than the right A3 occlusion). No significant stenosis of the middle cerebral arteries. POSTERIOR CIRCULATION: There is irregularity of the P1 and  P2 segments of the right PCA. Multifocal moderate to severe stenosis of the right P2 segment particularly along the midportion. There is additional irregularity of the left P2 segment. There is severe stenosis of the mid and distal P2 segment of the left PCA. No significant stenosis of the basilar artery. No significant stenosis of the vertebral arteries. The superior cerebellar arteries are not well visualized due to artifact. AICA visualized bilaterally, greater on the left. Origins of the PICAs visualized bilaterally. No aneurysm (in the posterior circulation). IMPRESSION: 1. Acute infarct in the left thalamus and left temporal periventricular white matter. 2. Additional restricted diffusion and abnormal FLAIR signal in the left hippocampus, which may represent additional infarct versus postictal changes. Consider EEG correlation if clinically indicated. 3. No evidence of acute LVO. 4. Multifocal moderate to severe stenosis of the right P2 segment. Severe stenosis of the mid/distal P2 segment of the left PCA. Evaluation of the P2 and P3 segments also somewhat limited by artifact and small vessel caliber. 5. 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 No evidence of right ACA territory infarct. 6. 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 Electronically signed by: Donnice Mania MD 12/26/2024 12:29 PM EST RP Workstation: [id number]   MR BRAIN WO CONTRAST Result Date: 12/26/2024 EXAM: MRI BRAIN WITHOUT CONTRAST MRA HEAD WITHOUT CONTRAST 12/26/2024 11:44:27 AM TECHNIQUE: Multiplanar multisequence MRI of the brain was performed without the administration of intravenous contrast. MRA of the head was performed without contrast using time-of-flight technique. 3D postprocessing with multiplanar reformations and MIPs was performed for better evaluation of the vasculature. COMPARISON: Same  day CT head. CLINICAL HISTORY: Neuro deficit, acute, stroke suspected. Acute neurological deficit; stroke suspected. FINDINGS: LIMITATIONS/ARTIFACTS: The MRI is somewhat limited by motion artifact. MRI BRAIN: BRAIN AND VENTRICLES: There is an 8 mm focus of restricted diffusion in the left thalamus compatible with acute infarct. Additional areas of restricted diffusion along the left hippocampus along with abnormal FLAIR signal intensity in this region; finding could reflect additional acute infarct versus postictal changes. Recommend correlation with clinical history and consider correlation with EEG findings if clinically indicated. T2 and FLAIR hyperintensity in the periventricular and subcortical white matter suggestive of mild chronic microvascular ischemic changes. There is mild age-related parenchymal volume loss. Chronic microhemorrhage in the left occipital lobe. No acute intracranial hemorrhage. No mass. No midline shift. No hydrocephalus. The sella is unremarkable. Normal flow voids. ORBITS: There is a 0.8 x 0.5 cm mass along the superolateral preseptal soft tissues of the left orbit which was present in 2005 and appears minimally increased in size; favor benign etiology. Bilateral lens replacement. Slight irregular contour at the posterior aspect of both globes suggestive of axial myopia. SINUSES AND MASTOIDS: Mucous retention cyst in the right maxillary sinus. No significant  abnormality in the mastoids. BONES AND SOFT TISSUES: Degenerative changes in the visualized upper cervical spine. There is at least mild spinal canal stenosis at the C4-C5 and C5-C6 levels. Normal bone marrow signal. No soft tissue abnormality. MRA HEAD: ANTERIOR CIRCULATION: The intracranial internal carotid arteries are patent bilaterally. There is mild irregularity of the carotid siphons likely related to artifact as well as atherosclerosis. There is a 3 mm inferiorly directed outpouching along the left supraclinoid ICA concerning  for small aneurysm at the origin of the posterior communicating artery. There is likely an additional 2.5 mm inferiorly directed outpouching at the left ICA terminus concerning for additional aneurysm. There is short segment occlusion of the A3 segment of the right ACA. There is reconstitution of the proximal right A4 segment. No significant stenosis of the anterior cerebral arteries (other than the right A3 occlusion). No significant stenosis of the middle cerebral arteries. POSTERIOR CIRCULATION: There is irregularity of the P1 and P2 segments of the right PCA. Multifocal moderate to severe stenosis of the right P2 segment particularly along the midportion. There is additional irregularity of the left P2 segment. There is severe stenosis of the mid and distal P2 segment of the left PCA. No significant stenosis of the basilar artery. No significant stenosis of the vertebral arteries. The superior cerebellar arteries are not well visualized due to artifact. AICA visualized bilaterally, greater on the left. Origins of the PICAs visualized bilaterally. No aneurysm (in the posterior circulation). IMPRESSION: 1. Acute infarct in the left thalamus and left temporal periventricular white matter. 2. Additional restricted diffusion and abnormal FLAIR signal in the left hippocampus, which may represent additional infarct versus postictal changes. Consider EEG correlation if clinically indicated. 3. No evidence of acute LVO. 4. Multifocal moderate to severe stenosis of the right P2 segment. Severe stenosis of the mid/distal P2 segment of the left PCA. Evaluation of the P2 and P3 segments also somewhat limited by artifact and small vessel caliber. 5. 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 No evidence of right ACA territory infarct. 6. 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 Electronically  signed by: Donnice Mania MD 12/26/2024 12:29 PM EST RP Workstation: [id number]   CT Head Wo Contrast Result Date: 12/26/2024 CLINICAL DATA:  Minor head trauma, weakness, injury EXAM: CT HEAD WITHOUT CONTRAST TECHNIQUE: Contiguous axial images were obtained from the base of the skull through the vertex without intravenous contrast. RADIATION DOSE REDUCTION: This exam was performed according to the departmental dose-optimization program which includes automated exposure control, adjustment of the mA and/or kV according to patient size and/or use of iterative reconstruction technique. COMPARISON:  11/03/2004 FINDINGS: Brain: Diffuse mild brain atrophy pattern and minor white matter microvascular ischemic changes throughout the periventricular regions of both cerebral hemispheres. No acute intracranial hemorrhage, definite acute infarction, mass lesion, midline shift, herniation, hydrocephalus, or extra-axial fluid collection. No focal mass effect or edema. Cisterns are patent. Cerebellar atrophy as well. Vascular: No hyperdense vessel or unexpected calcification. Skull: Normal. Negative for fracture or focal lesion. Sinuses/Orbits: No acute finding. Other: None. IMPRESSION: 1. Brain atrophy and white matter microvascular ischemic changes. 2. No acute intracranial abnormality by noncontrast CT. Electronically Signed   By: CHRISTELLA.  Shick M.D.   On: 12/26/2024 10:16   DG Chest Portable 1 View Result Date: 12/26/2024 CLINICAL DATA:  Weakness EXAM: PORTABLE CHEST 1 VIEW COMPARISON:  Apr 02, 2011 FINDINGS: The heart size and mediastinal contours are within normal limits. Both lungs are clear. The visualized skeletal structures are unremarkable. IMPRESSION: No active disease. Electronically Signed   By: Lynwood Landy Raddle M.D.   On: 12/26/2024 09:22        Scheduled Meds:   stroke: early stages of recovery book   Does not apply Once   artificial tears  1 drop Both Eyes BID   aspirin  EC  81 mg Oral Daily   atorvastatin    40 mg Oral Daily   clopidogrel   75 mg Oral Daily   heparin   5,000 Units Subcutaneous Q8H   metoprolol  succinate  100 mg Oral Daily   pantoprazole   40 mg Oral Daily   Continuous Infusions:  sodium chloride  50 mL/hr at 12/26/24 1406     LOS: 0 days    Time spent: 35 Minutes    Maurita Havener A Mica Releford, MD [facility]   If 7PM-7AM, please contact night-coverage [email or web address]  12/27/2024, 9:17 AM   "

## 2024-12-27 NOTE — Progress Notes (Signed)
  Inpatient Rehab Admissions Coordinator :  Per therapy recommendations, patient was screened for CIR candidacy by Ottie Glazier RN MSN.  At this time patient appears to be a potential candidate for CIR. I will place a rehab consult per protocol for full assessment. Please call me with any questions.  Ottie Glazier RN MSN Admissions Coordinator [phone]

## 2024-12-27 NOTE — Progress Notes (Signed)
 Carotid duplex has been completed.   Results can be found under chart review under CV PROC. 12/27/2024 3:17 PM Barrie Wale RVT, RDMS

## 2024-12-27 NOTE — Evaluation (Signed)
 Physical Therapy Evaluation Patient Details Name: [name] MRN: [id number] DOB: 1931-01-15 Today's Date: 12/27/2024  History of Present Illness  Pt is a 89 y.o female presenting to [facility] for concerns for CVA. Pt report LE weakness and speech changes. 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 PMH: HTN, HLD, spinal stenosis  Clinical Impression  Pt admitted secondary to problem above with deficits below. PTA patient lived alone in one level house with 3 steps to enter (with rail). Pt currently was unable to progress to ambulation due to lightheadedness. Reports she ambulated to bathroom with RN and RW earlier today without dizziness. Returned to sitting and head improved, yet when stood again she again became lightheaded. Patient returned to supine. Made OT aware that pt may have been orthostatic with recommendation to check orthostatic BPs during their session. Anticipate patient will benefit from PT to address problems listed below. Will continue to follow acutely to maximize functional mobility, independence, and safety.  Patient will benefit from continued inpatient follow up therapy, >3 hours/day          If plan is discharge home, recommend the following: A little help with walking and/or transfers;A little help with bathing/dressing/bathroom;Assistance with cooking/housework;Assist for transportation;Help with stairs or ramp for entrance   Can travel by private vehicle        Equipment Recommendations Rolling walker (2 wheels)  Recommendations for Other Services  Rehab consult    Functional Status Assessment Patient has had a recent decline in their functional status and demonstrates the ability to make significant improvements in function in a reasonable and predictable amount of time.     Precautions / Restrictions Precautions Precautions: Fall Recall of Precautions/Restrictions: Intact      Mobility  Bed Mobility Overal bed  mobility: Needs Assistance Bed Mobility: Supine to Sit, Sit to Supine     Supine to sit: Contact guard, HOB elevated Sit to supine: Min assist   General bed mobility comments: min assist to raise RLE onto bed    Transfers Overall transfer level: Needs assistance Equipment used: Rolling walker (2 wheels) Transfers: Sit to/from Stand Sit to Stand: Min assist           General transfer comment: vc for hand placement; +lightheadedness on initial stand and returned to sitting; stood again CGA and again incr dizziness    Ambulation/Gait Ambulation/Gait assistance: Min assist Gait Distance (Feet): 1 Feet Assistive device: Rolling walker (2 wheels) Gait Pattern/deviations: Step-to pattern, Decreased step length - right, Shuffle       General Gait Details: initiated gait with pt sliding RLE to advance; after 2 step forward reported too dizzy and stepped backwards to bed to sit down  Stairs            Wheelchair Mobility     Tilt Bed    Modified Rankin (Stroke Patients Only) Modified Rankin (Stroke Patients Only) Pre-Morbid Rankin Score: No symptoms Modified Rankin: Moderately severe disability     Balance Overall balance assessment: Needs assistance, History of Falls Sitting-balance support: No upper extremity supported, Feet supported Sitting balance-Leahy Scale: Fair     Standing balance support: Bilateral upper extremity supported, During functional activity Standing balance-Leahy Scale: Poor                               Pertinent Vitals/Pain Pain Assessment Pain Assessment: No/denies pain    Home Living Family/patient expects to  be discharged to:: Private residence Living Arrangements: Alone   Type of Home: House Home Access: Stairs to enter Entrance Stairs-Rails: Left Entrance Stairs-Number of Steps: 3   Home Layout: One level Home Equipment: Grab bars - tub/shower      Prior Function Prior Level of Function :  Independent/Modified Independent;Driving             Mobility Comments: walks for exercise ADLs Comments: gets down into tub to bathe     Extremity/Trunk Assessment   Upper Extremity Assessment Upper Extremity Assessment: Defer to OT evaluation    Lower Extremity Assessment Lower Extremity Assessment: RLE deficits/detail RLE Deficits / Details: AROM WFL; hip flexion 2+, knee extension 3+, ankle DF 5 RLE Sensation: WNL    Cervical / Trunk Assessment Cervical / Trunk Assessment: Normal  Communication   Communication Communication: Impaired Factors Affecting Communication: Difficulty expressing self    Cognition Arousal: Alert Behavior During Therapy: Anxious   PT - Cognitive impairments: Orientation   Orientation impairments: Time                   PT - Cognition Comments: Not sure of day of the weel Following commands: Intact       Cueing Cueing Techniques: Verbal cues, Gestural cues     General Comments General comments (skin integrity, edema, etc.): Son arrived at end of session.    Exercises     Assessment/Plan    PT Assessment Patient needs continued PT services  PT Problem List Decreased strength;Decreased activity tolerance;Decreased balance;Decreased mobility;Decreased knowledge of use of DME;Cardiopulmonary status limiting activity       PT Treatment Interventions DME instruction;Gait training;Stair training;Functional mobility training;Therapeutic activities;Therapeutic exercise;Balance training;Neuromuscular re-education;Patient/family education    PT Goals (Current goals can be found in the Care Plan section)  Acute Rehab PT Goals Patient Stated Goal: return home PT Goal Formulation: With patient Time For Goal Achievement: 01/10/25 Potential to Achieve Goals: Good    Frequency Min 3X/week     Co-evaluation               AM-PAC PT 6 Clicks Mobility  Outcome Measure Help needed turning from your back to your side while  in a flat bed without using bedrails?: None Help needed moving from lying on your back to sitting on the side of a flat bed without using bedrails?: A Little Help needed moving to and from a bed to a chair (including a wheelchair)?: A Little Help needed standing up from a chair using your arms (e.g., wheelchair or bedside chair)?: A Little Help needed to walk in hospital room?: Total Help needed climbing 3-5 steps with a railing? : Total 6 Click Score: 15    End of Session Equipment Utilized During Treatment: Gait belt Activity Tolerance: Treatment limited secondary to medical complications (Comment) (lightheadedness) Patient left: in bed;with call bell/phone within reach;with bed alarm set;with family/visitor present Nurse Communication: Mobility status;Other (comment) (lightheadedness) PT Visit Diagnosis: Other abnormalities of gait and mobility (R26.89);Muscle weakness (generalized) (M62.81);History of falling (Z91.81);Dizziness and giddiness (R42)    Time: 9153-9088 PT Time Calculation (min) (ACUTE ONLY): 25 min   Charges:   PT Evaluation $PT Eval Low Complexity: 1 Low PT Treatments $Therapeutic Activity: 8-22 mins PT General Charges $$ ACUTE PT VISIT: 1 Visit          Macario RAMAN, PT Acute Rehabilitation Services  Office [phone]   Macario SHAUNNA Soja 12/27/2024, 9:27 AM

## 2024-12-27 NOTE — Progress Notes (Signed)
 STROKE TEAM PROGRESS NOTE   SUBJECTIVE (INTERVAL HISTORY) Her son and daughters are at the bedside.  Overall her condition is stable. She is sitting in chair, still has some R sided incoordination. But sensory intact. Pending CIR.    OBJECTIVE Temp:  [97.7 F (36.5 C)-98.5 F (36.9 C)] 97.8 F (36.6 C) (02/04 0728) Pulse Rate:  [69-95] 69 (02/04 0728) Cardiac Rhythm: Normal sinus rhythm (02/04 0700) Resp:  [15-19] 18 (02/04 0728) BP: (155-180)/(64-89) 180/76 (02/04 0728) SpO2:  [96 %-99 %] 97 % (02/04 0728)  No results for input(s): GLUCAP in the last 168 hours. Recent Labs  Lab 12/26/24 0830 12/27/24 0156  NA 130* 131*  K 5.4* 5.4*  CL 98 102  CO2 19* 23  GLUCOSE 106* 86  BUN 30* 22  CREATININE 1.55* 1.18*  CALCIUM  9.6 8.9  MG 1.9  --    Recent Labs  Lab 12/26/24 0830  AST 17  ALT 10  ALKPHOS 74  BILITOT 0.4  PROT 6.8  ALBUMIN 4.0   Recent Labs  Lab 12/26/24 0830 12/27/24 0156  WBC 6.7 5.5  NEUTROABS 4.8  --   HGB 11.5* 10.4*  HCT 34.6* 30.2*  MCV 95.8 92.9  PLT 211 203   No results for input(s): CKTOTAL, CKMB, CKMBINDEX, TROPONINI in the last 168 hours. No results for input(s): LABPROT, INR in the last 72 hours. Recent Labs    12/26/24 0910  COLORURINE STRAW*  LABSPEC 1.004*  PHURINE 6.0  GLUCOSEU NEGATIVE  HGBUR SMALL*  BILIRUBINUR NEGATIVE  KETONESUR NEGATIVE  PROTEINUR 100*  NITRITE NEGATIVE  LEUKOCYTESUR TRACE*       Component Value Date/Time   CHOL 172 12/27/2024 0156   TRIG 66 12/27/2024 0156   HDL 42 12/27/2024 0156   CHOLHDL 4.1 12/27/2024 0156   VLDL 13 12/27/2024 0156   LDLCALC 116 (H) 12/27/2024 0156   Lab Results  Component Value Date   HGBA1C 5.3 12/27/2024   No results found for: LABOPIA, COCAINSCRNUR, LABBENZ, AMPHETMU, THCU, LABBARB  No results for input(s): ETH in the last 168 hours.  I have personally reviewed the radiological images below and agree with the radiology  interpretations.  EEG adult Result Date: 12/27/2024 Shelton Arlin KIDD, MD     12/27/2024 10:17 AM Patient Name: [name] MRN: [id number] Epilepsy Attending: Arlin KIDD Shelton Referring Physician/Provider: Jerri Pfeiffer, MD Date: 12/27/2024 Duration: 23.26 mins Patient history: 89 y.o. female with chief complaint of right-sided weakness numbness. EEG to evaluate for seizure Level of alertness: Awake AEDs during EEG study: None Technical aspects: This EEG study was done with scalp electrodes positioned according to the 10-20 International system of electrode placement. Electrical activity was reviewed with band pass filter of 1-70Hz , sensitivity of 7 uV/mm, display speed of 75mm/sec with a 60Hz  notched filter applied as appropriate. EEG data were recorded continuously and digitally stored.  Video monitoring was available and reviewed as appropriate. Description: The posterior dominant rhythm consists of 8 Hz activity of moderate voltage (25-35 uV) seen predominantly in posterior head regions, symmetric and reactive to eye opening and eye closing. Hyperventilation and photic stimulation were not performed.   IMPRESSION: This study is within normal limits. No seizures or epileptiform discharges were seen throughout the recording. A normal interictal EEG does not exclude the diagnosis of epilepsy. Arlin KIDD Shelton   MR ANGIO HEAD WO CONTRAST Result Date: 12/26/2024 EXAM: MRI BRAIN WITHOUT CONTRAST MRA HEAD WITHOUT CONTRAST 12/26/2024 11:44:27 AM TECHNIQUE: Multiplanar multisequence MRI of the brain was  performed without the administration of intravenous contrast. MRA of the head was performed without contrast using time-of-flight technique. 3D postprocessing with multiplanar reformations and MIPs was performed for better evaluation of the vasculature. COMPARISON: Same day CT head. CLINICAL HISTORY: Neuro deficit, acute, stroke suspected. Acute neurological deficit; stroke suspected. FINDINGS: LIMITATIONS/ARTIFACTS:  The MRI is somewhat limited by motion artifact. MRI BRAIN: BRAIN AND VENTRICLES: There is an 8 mm focus of restricted diffusion in the left thalamus compatible with acute infarct. Additional areas of restricted diffusion along the left hippocampus along with abnormal FLAIR signal intensity in this region; finding could reflect additional acute infarct versus postictal changes. Recommend correlation with clinical history and consider correlation with EEG findings if clinically indicated. T2 and FLAIR hyperintensity in the periventricular and subcortical white matter suggestive of mild chronic microvascular ischemic changes. There is mild age-related parenchymal volume loss. Chronic microhemorrhage in the left occipital lobe. No acute intracranial hemorrhage. No mass. No midline shift. No hydrocephalus. The sella is unremarkable. Normal flow voids. ORBITS: There is a 0.8 x 0.5 cm mass along the superolateral preseptal soft tissues of the left orbit which was present in 2005 and appears minimally increased in size; favor benign etiology. Bilateral lens replacement. Slight irregular contour at the posterior aspect of both globes suggestive of axial myopia. SINUSES AND MASTOIDS: Mucous retention cyst in the right maxillary sinus. No significant abnormality in the mastoids. BONES AND SOFT TISSUES: Degenerative changes in the visualized upper cervical spine. There is at least mild spinal canal stenosis at the C4-C5 and C5-C6 levels. Normal bone marrow signal. No soft tissue abnormality. MRA HEAD: ANTERIOR CIRCULATION: The intracranial internal carotid arteries are patent bilaterally. There is mild irregularity of the carotid siphons likely related to artifact as well as atherosclerosis. There is a 3 mm inferiorly directed outpouching along the left supraclinoid ICA concerning for small aneurysm at the origin of the posterior communicating artery. There is likely an additional 2.5 mm inferiorly directed outpouching at the  left ICA terminus concerning for additional aneurysm. There is short segment occlusion of the A3 segment of the right ACA. There is reconstitution of the proximal right A4 segment. No significant stenosis of the anterior cerebral arteries (other than the right A3 occlusion). No significant stenosis of the middle cerebral arteries. POSTERIOR CIRCULATION: There is irregularity of the P1 and P2 segments of the right PCA. Multifocal moderate to severe stenosis of the right P2 segment particularly along the midportion. There is additional irregularity of the left P2 segment. There is severe stenosis of the mid and distal P2 segment of the left PCA. No significant stenosis of the basilar artery. No significant stenosis of the vertebral arteries. The superior cerebellar arteries are not well visualized due to artifact. AICA visualized bilaterally, greater on the left. Origins of the PICAs visualized bilaterally. No aneurysm (in the posterior circulation). IMPRESSION: 1. Acute infarct in the left thalamus and left temporal periventricular white matter. 2. Additional restricted diffusion and abnormal FLAIR signal in the left hippocampus, which may represent additional infarct versus postictal changes. Consider EEG correlation if clinically indicated. 3. No evidence of acute LVO. 4. Multifocal moderate to severe stenosis of the right P2 segment. Severe stenosis of the mid/distal P2 segment of the left PCA. Evaluation of the P2 and P3 segments also somewhat limited by artifact and small vessel caliber. 5. 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 No evidence of right ACA  territory infarct. 6. 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 Electronically signed by: Donnice Mania MD 12/26/2024 12:29 PM EST RP Workstation: [id number]   MR BRAIN WO CONTRAST Result Date: 12/26/2024 EXAM: MRI BRAIN  WITHOUT CONTRAST MRA HEAD WITHOUT CONTRAST 12/26/2024 11:44:27 AM TECHNIQUE: Multiplanar multisequence MRI of the brain was performed without the administration of intravenous contrast. MRA of the head was performed without contrast using time-of-flight technique. 3D postprocessing with multiplanar reformations and MIPs was performed for better evaluation of the vasculature. COMPARISON: Same day CT head. CLINICAL HISTORY: Neuro deficit, acute, stroke suspected. Acute neurological deficit; stroke suspected. FINDINGS: LIMITATIONS/ARTIFACTS: The MRI is somewhat limited by motion artifact. MRI BRAIN: BRAIN AND VENTRICLES: There is an 8 mm focus of restricted diffusion in the left thalamus compatible with acute infarct. Additional areas of restricted diffusion along the left hippocampus along with abnormal FLAIR signal intensity in this region; finding could reflect additional acute infarct versus postictal changes. Recommend correlation with clinical history and consider correlation with EEG findings if clinically indicated. T2 and FLAIR hyperintensity in the periventricular and subcortical white matter suggestive of mild chronic microvascular ischemic changes. There is mild age-related parenchymal volume loss. Chronic microhemorrhage in the left occipital lobe. No acute intracranial hemorrhage. No mass. No midline shift. No hydrocephalus. The sella is unremarkable. Normal flow voids. ORBITS: There is a 0.8 x 0.5 cm mass along the superolateral preseptal soft tissues of the left orbit which was present in 2005 and appears minimally increased in size; favor benign etiology. Bilateral lens replacement. Slight irregular contour at the posterior aspect of both globes suggestive of axial myopia. SINUSES AND MASTOIDS: Mucous retention cyst in the right maxillary sinus. No significant abnormality in the mastoids. BONES AND SOFT TISSUES: Degenerative changes in the visualized upper cervical spine. There is at least mild spinal  canal stenosis at the C4-C5 and C5-C6 levels. Normal bone marrow signal. No soft tissue abnormality. MRA HEAD: ANTERIOR CIRCULATION: The intracranial internal carotid arteries are patent bilaterally. There is mild irregularity of the carotid siphons likely related to artifact as well as atherosclerosis. There is a 3 mm inferiorly directed outpouching along the left supraclinoid ICA concerning for small aneurysm at the origin of the posterior communicating artery. There is likely an additional 2.5 mm inferiorly directed outpouching at the left ICA terminus concerning for additional aneurysm. There is short segment occlusion of the A3 segment of the right ACA. There is reconstitution of the proximal right A4 segment. No significant stenosis of the anterior cerebral arteries (other than the right A3 occlusion). No significant stenosis of the middle cerebral arteries. POSTERIOR CIRCULATION: There is irregularity of the P1 and P2 segments of the right PCA. Multifocal moderate to severe stenosis of the right P2 segment particularly along the midportion. There is additional irregularity of the left P2 segment. There is severe stenosis of the mid and distal P2 segment of the left PCA. No significant stenosis of the basilar artery. No significant stenosis of the vertebral arteries. The superior cerebellar arteries are not well visualized due to artifact. AICA visualized bilaterally, greater on the left. Origins of the PICAs visualized bilaterally. No aneurysm (in the posterior circulation). IMPRESSION: 1. Acute infarct in the left thalamus and left temporal periventricular white matter. 2. Additional restricted diffusion and abnormal FLAIR signal in the left hippocampus, which may represent additional infarct versus postictal changes. Consider EEG correlation if clinically indicated. 3. No evidence of acute LVO. 4. Multifocal moderate to  severe stenosis of the right P2 segment. Severe stenosis of the mid/distal P2 segment of  the left PCA. Evaluation of the P2 and P3 segments also somewhat limited by artifact and small vessel caliber. 5. 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 No evidence of right ACA territory infarct. 6. 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 Electronically signed by: Donnice Mania MD 12/26/2024 12:29 PM EST RP Workstation: [id number]   CT Head Wo Contrast Result Date: 12/26/2024 CLINICAL DATA:  Minor head trauma, weakness, injury EXAM: CT HEAD WITHOUT CONTRAST TECHNIQUE: Contiguous axial images were obtained from the base of the skull through the vertex without intravenous contrast. RADIATION DOSE REDUCTION: This exam was performed according to the departmental dose-optimization program which includes automated exposure control, adjustment of the mA and/or kV according to patient size and/or use of iterative reconstruction technique. COMPARISON:  11/03/2004 FINDINGS: Brain: Diffuse mild brain atrophy pattern and minor white matter microvascular ischemic changes throughout the periventricular regions of both cerebral hemispheres. No acute intracranial hemorrhage, definite acute infarction, mass lesion, midline shift, herniation, hydrocephalus, or extra-axial fluid collection. No focal mass effect or edema. Cisterns are patent. Cerebellar atrophy as well. Vascular: No hyperdense vessel or unexpected calcification. Skull: Normal. Negative for fracture or focal lesion. Sinuses/Orbits: No acute finding. Other: None. IMPRESSION: 1. Brain atrophy and white matter microvascular ischemic changes. 2. No acute intracranial abnormality by noncontrast CT. Electronically Signed   By: CHRISTELLA.  Shick M.D.   On: 12/26/2024 10:16   DG Chest Portable 1 View Result Date: 12/26/2024 CLINICAL DATA:  Weakness EXAM: PORTABLE CHEST 1 VIEW COMPARISON:  Apr 02, 2011 FINDINGS: The heart size and mediastinal  contours are within normal limits. Both lungs are clear. The visualized skeletal structures are unremarkable. IMPRESSION: No active disease. Electronically Signed   By: Lynwood Landy Raddle M.D.   On: 12/26/2024 09:22     PHYSICAL EXAM  Temp:  [97.7 F (36.5 C)-98.5 F (36.9 C)] 97.8 F (36.6 C) (02/04 0728) Pulse Rate:  [69-95] 69 (02/04 0728) Resp:  [15-19] 18 (02/04 0728) BP: (155-180)/(64-89) 180/76 (02/04 0728) SpO2:  [96 %-99 %] 97 % (02/04 0728)  General - Well nourished, well developed, in no apparent distress.  Ophthalmologic - fundi not visualized due to noncooperation.  Cardiovascular - Regular rhythm and rate.  Neuro - awake, alert, eyes open, orientated to age, place, time and people. No aphasia, fluent language, following all simple commands. Able to name and repeat. No gaze palsy, tracking bilaterally, visual field full, PERRL. No facial droop. Tongue midline. Bilateral UEs 5/5, no drift. However, R hand dexterity decreased. LLE 5/5, RLE 4+/5. Sensation symmetrical bilaterally, b/l FTN and HTS intact but much slower on the right for action, gait not tested.     ASSESSMENT/PLAN [name] is a 89 y.o. female with history of HTN, HLD, PVCs admitted for R sided weakness and numbness for 2 days with unsteadiness. No TNK given due to outside window.    Stroke:  left PCA infarcts involving left thalamus and hippocampus, likely secondary to R PCA severe stenosis in the setting of AKI, dehydration and diarrhea Resultant CT no acute finding MRI  Acute infarct in the left thalamus and left temporal periventricular white matter. Additional restricted diffusion and abnormal FLAIR signal in the left Hippocampus. MRA  Multifocal moderate to severe stenosis of the right P2 segment.  Severe stenosis of the mid/distal P2 segment of the left PCA. Evaluation of the P2 and P3 segments also somewhat limited by artifact and small vessel caliber. Short segment occlusion of the A3 segment  of the right ACA with reconstitution of the proximal right A4 segment, likely chronic.  EEG normal Carotid Doppler unremarkable 2D Echo EF 55-60% LDL 116 HgbA1c 5.3 Heparin  subq for VTE prophylaxis aspirin  81 mg daily prior to admission, now on aspirin  81 mg daily and clopidogrel  75 mg daily DAPT for 3 months and then plavix  alone given the left PCA stenosis Patient counseled to be compliant with her antithrombotic medications Ongoing aggressive stroke risk factor management Therapy recommendations:  CIR Disposition:  pending  AKI due to diarrhea Baseline Cre 0.8 This time Cre 1.55--1.18--1.13 On IVF BMP monitoring  Hypertension Home meds hygroton 25, toprol  50, spironolactone  25 Stable on the high end On metoprolol  50 Avoid low BP Home BP monitoring Long term BP goal normotensive  Hyperlipidemia Home meds:  none  LDL 116, goal < 70 Now on lipitor 40 Continue statin at discharge  Other Stroke Risk Factors Advanced age  Other Active Problems PVCs Mild hyponatremia Na 130-131-131 Cerebral aneurysm - MRA Likely small aneurysms at the left supraclinoid ICA (3 mm) and left ICA terminus (2.5 mm). Outpt clinical monitoring.   Hospital day # 0  Neurology will sign off. Please call with questions. Pt will follow up with stroke clinic NP at [facility] in about 4 weeks. Thanks for the consult.   Ary Cummins, MD PhD Stroke Neurology 12/27/2024 11:08 AM    To contact Stroke Continuity provider, please refer to [email or web address]. After hours, contact General Neurology

## 2024-12-27 NOTE — Progress Notes (Signed)
 SDOH reviewed and resources added to AVS.

## 2024-12-27 NOTE — Consult Note (Signed)
 NEUROLOGY CONSULT NOTE   Date of service: December 27, 2024 Patient Name: [name] MRN:  [id number] DOB:  [birth year] Chief Complaint: Weakness and numbness Requesting Provider: Ricky Fines, MD   ASSESSMENT   Stroke -multiple areas of acute stroke in the left PCA distribution on MRI.  The strokes explain patient's symptoms of right-sided numbness and weakness.  No TNK due to duration of symptoms.  Etiology may be related to severe bilateral, multifocal PCA stenosis noted on CT angiogram.  Labs reviewed.  Patient is on aspirin  and has been now started on Plavix .  RECOMMENDATIONS  - Admit for workup of stroke - Echocardiogram routine - Continue aspirin  and Plavix  - Lipid panel - Hemoglobin A1c - Neurology will follow ________________________________  History of Present Illness  [name] is a 89 y.o. female with chief complaint of right-sided weakness numbness which started 2 days ago.  Symptoms are mild to moderate, course gradually improving but not back to baseline.  Patient is also complaining of unsteadiness.  LKW: 2 days ago Modified rankin score: 0-Completely asymptomatic and back to baseline post- stroke   ROS  Comprehensive ROS performed and pertinent positives documented in HPI    Past History   Past Medical History:  Diagnosis Date   Anxiety    Essential hypertension    GERD (gastroesophageal reflux disease)    Hyperlipidemia    Palpitations    Reportedly symptomatic PVCs    Spinal stenosis    Vaginal atrophy 04/18/2015   Vaginal itching 04/18/2015    Past Surgical History:  Procedure Laterality Date   Bilateral breast cyst  1964   CATARACT EXTRACTION W/PHACO Right 03/02/2016   Procedure: CATARACT EXTRACTION PHACO AND INTRAOCULAR LENS PLACEMENT (IOC);  Surgeon: Cherene Mania, MD;  Location: [facility];  Service: Ophthalmology;  Laterality: Right;  CDE 10.57   Caturact Surgery  2010   CHOLECYSTECTOMY     04/07/2011   COLONOSCOPY     11/04/2010  Dr.Rourk   DILATION AND CURETTAGE OF UTERUS  1972   UPPER GASTROINTESTINAL ENDOSCOPY      Family History: Family History  Problem Relation Age of Onset   Healthy Son    Heart disease Father    Dementia Mother    Alzheimer's disease Mother    Tuberculosis Maternal Grandmother    Diabetes Maternal Grandmother    Alzheimer's disease Maternal Grandfather    Hypertension Paternal Grandfather    Stroke Paternal Grandfather     Social History  reports that she has never smoked. She has never used smokeless tobacco. She reports current alcohol  use. She reports that she does not use drugs.  Allergies[1]  Medications  Current Medications[2]  Vitals   Vitals:   12/26/24 1555 12/26/24 1956 12/26/24 2323 12/27/24 0437  BP: (!) 174/84 (!) 155/68 (!) 174/68 (!) 164/64  Pulse: 87 80 80 70  Resp: 19 18 18 18   Temp: 97.7 F (36.5 C) 98.5 F (36.9 C) 97.9 F (36.6 C) 97.9 F (36.6 C)  TempSrc: Oral Oral Oral Oral  SpO2: 99% 97% 98% 96%  Weight:      Height:        Body mass index is 27.13 kg/m.   Physical Exam   Constitutional: Appears well-developed and well-nourished.  Psych: Affect appropriate to situation. Eyes: No scleral injection. HENT: No OP obstruction. Head: Normocephalic. Cardiovascular: Normal rate and regular rhythm.  Respiratory: Effort normal, non-labored breathing. GI: No distension.  Skin: WDI.  Neurologic Examination   Patient is alert and  oriented x 3.  Pupils are equal round and reactive to light.  Speech is clear.  Language is fluent.  Face is symmetric.  Extraocular movements are normal.  Visual fields are full to confrontation.  Tone is normal.  Strength is 3 out of 5 in right leg.  Sensation is normal to light touch in the face and extremities.  Labs/Imaging/Neurodiagnostic studies   CBC:  Recent Labs  Lab 01/21/25 0830 12/27/24 0156  WBC 6.7 5.5  NEUTROABS 4.8  --   HGB 11.5* 10.4*  HCT 34.6* 30.2*  MCV 95.8 92.9  PLT 211 203   Basic  Metabolic Panel:  Lab Results  Component Value Date   NA 131 (L) 12/27/2024   K 5.4 (H) 12/27/2024   CO2 23 12/27/2024   GLUCOSE 86 12/27/2024   BUN 22 12/27/2024   CREATININE 1.18 (H) 12/27/2024   CALCIUM  8.9 12/27/2024   GFRNONAA 43 (L) 12/27/2024   GFRAA >60 08/01/2020   Lipid Panel:  Lab Results  Component Value Date   LDLCALC 116 (H) 12/27/2024   HgbA1c:  Lab Results  Component Value Date   HGBA1C 5.3 12/27/2024   Urine Drug Screen: No results found for: LABOPIA, COCAINSCRNUR, LABBENZ, AMPHETMU, THCU, LABBARB  Alcohol  Level No results found for: ETH INR No results found for: INR APTT No results found for: APTT AED levels: No results found for: PHENYTOIN, ZONISAMIDE, LAMOTRIGINE, LEVETIRACETA   ______________________________________    Signed, Jarad Barth M Charlyne Robertshaw, MD [facility]     [1]  Allergies Allergen Reactions   Tricyclic Antidepressants Other (See Comments)    felt like I was going to have a seizure  [2]  Current Facility-Administered Medications:     stroke: early stages of recovery book, , Does not apply, Once, Ricky Fines, MD   0.9 %  sodium chloride  infusion, , Intravenous, Continuous, Ricky Fines, MD, Last Rate: 50 mL/hr at January 21, 2025 1406, New Bag at 01/21/2025 1406   acetaminophen  (TYLENOL ) tablet 650 mg, 650 mg, Oral, Q4H PRN, 650 mg at January 21, 2025 2044 **OR** acetaminophen  (TYLENOL ) 160 MG/5ML solution 650 mg, 650 mg, Per Tube, Q4H PRN **OR** acetaminophen  (TYLENOL ) suppository 650 mg, 650 mg, Rectal, Q4H PRN, Ricky Fines, MD   artificial tears ophthalmic solution 1 drop, 1 drop, Both Eyes, BID, Ricky Fines, MD, 1 drop at January 21, 2025 1819   aspirin  EC tablet 81 mg, 81 mg, Oral, Daily, Ricky Fines, MD, 81 mg at 21-Jan-2025 1644   clopidogrel  (PLAVIX ) tablet 75 mg, 75 mg, Oral, Daily, Ricky Fines, MD, 75 mg at 2025-01-21 1644   heparin  injection 5,000 Units, 5,000 Units, Subcutaneous, Q8H, Ricky Fines,  MD, 5,000 Units at Jan 21, 2025 2113   hydrALAZINE  (APRESOLINE ) injection 10 mg, 10 mg, Intravenous, Q8H PRN, Ricky Fines, MD   LORazepam  (ATIVAN ) tablet 0.5 mg, 0.5 mg, Oral, Q8H PRN, Ricky Fines, MD, 0.5 mg at 2025/01/21 2044   metoprolol  succinate (TOPROL -XL) 24 hr tablet 100 mg, 100 mg, Oral, Daily, Ricky Fines, MD, 100 mg at 21-Jan-2025 1644   pantoprazole  (PROTONIX ) EC tablet 40 mg, 40 mg, Oral, Daily, Ricky Fines, MD, 40 mg at Jan 21, 2025 1644   senna-docusate (Senokot-S) tablet 1 tablet, 1 tablet, Oral, QHS PRN, Ricky Fines, MD

## 2024-12-27 NOTE — Plan of Care (Signed)
 " Problem: Education: Goal: Knowledge of disease or condition will improve 12/27/2024 0553 by Daniel Cipriano RAMAN, RN Outcome: Progressing 12/27/2024 0552 by Daniel Cipriano RAMAN, RN Outcome: Progressing Goal: Knowledge of secondary prevention will improve (MUST DOCUMENT ALL) 12/27/2024 0553 by Daniel Cipriano RAMAN, RN Outcome: Progressing 12/27/2024 0552 by Daniel Cipriano RAMAN, RN Outcome: Progressing Goal: Knowledge of patient specific risk factors will improve (DELETE if not current risk factor) 12/27/2024 0553 by Daniel Cipriano RAMAN, RN Outcome: Progressing 12/27/2024 0552 by Daniel Cipriano RAMAN, RN Outcome: Progressing   Problem: Ischemic Stroke/TIA Tissue Perfusion: Goal: Complications of ischemic stroke/TIA will be minimized 12/27/2024 0553 by Daniel Cipriano RAMAN, RN Outcome: Progressing 12/27/2024 0552 by Daniel Cipriano RAMAN, RN Outcome: Progressing   Problem: Coping: Goal: Will verbalize positive feelings about self 12/27/2024 0553 by Daniel Cipriano RAMAN, RN Outcome: Progressing 12/27/2024 0552 by Daniel Cipriano RAMAN, RN Outcome: Progressing Goal: Will identify appropriate support needs 12/27/2024 0553 by Daniel Cipriano RAMAN, RN Outcome: Progressing 12/27/2024 0552 by Daniel Cipriano RAMAN, RN Outcome: Progressing   Problem: Health Behavior/Discharge Planning: Goal: Ability to manage health-related needs will improve 12/27/2024 0553 by Daniel Cipriano RAMAN, RN Outcome: Progressing 12/27/2024 0552 by Daniel Cipriano RAMAN, RN Outcome: Progressing Goal: Goals will be collaboratively established with patient/family 12/27/2024 [phone] by Daniel Cipriano RAMAN, RN Outcome: Progressing 12/27/2024 0552 by Daniel Cipriano RAMAN, RN Outcome: Progressing   Problem: Self-Care: Goal: Ability to participate in self-care as condition permits will improve 12/27/2024 0553 by Daniel Cipriano RAMAN, RN Outcome: Progressing 12/27/2024 0552 by Daniel Cipriano RAMAN, RN Outcome: Progressing Goal: Verbalization of feelings and concerns over  difficulty with self-care will improve 12/27/2024 0553 by Daniel Cipriano RAMAN, RN Outcome: Progressing 12/27/2024 0552 by Daniel Cipriano RAMAN, RN Outcome: Progressing Goal: Ability to communicate needs accurately will improve 12/27/2024 0553 by Daniel Cipriano RAMAN, RN Outcome: Progressing 12/27/2024 0552 by Daniel Cipriano RAMAN, RN Outcome: Progressing   Problem: Nutrition: Goal: Risk of aspiration will decrease 12/27/2024 0553 by Daniel Cipriano RAMAN, RN Outcome: Progressing 12/27/2024 0552 by Daniel Cipriano RAMAN, RN Outcome: Progressing Goal: Dietary intake will improve 12/27/2024 0553 by Daniel Cipriano RAMAN, RN Outcome: Progressing 12/27/2024 0552 by Daniel Cipriano RAMAN, RN Outcome: Progressing   Problem: Education: Goal: Knowledge of General Education information will improve Description: Including pain rating scale, medication(s)/side effects and non-pharmacologic comfort measures 12/27/2024 0553 by Daniel Cipriano RAMAN, RN Outcome: Progressing 12/27/2024 0552 by Daniel Cipriano RAMAN, RN Outcome: Progressing   Problem: Health Behavior/Discharge Planning: Goal: Ability to manage health-related needs will improve 12/27/2024 0553 by Daniel Cipriano RAMAN, RN Outcome: Progressing 12/27/2024 0552 by Daniel Cipriano RAMAN, RN Outcome: Progressing   Problem: Clinical Measurements: Goal: Ability to maintain clinical measurements within normal limits will improve 12/27/2024 0553 by Daniel Cipriano RAMAN, RN Outcome: Progressing 12/27/2024 0552 by Daniel Cipriano RAMAN, RN Outcome: Progressing Goal: Will remain free from infection 12/27/2024 0553 by Daniel Cipriano RAMAN, RN Outcome: Progressing 12/27/2024 0552 by Daniel Cipriano RAMAN, RN Outcome: Progressing Goal: Diagnostic test results will improve 12/27/2024 0553 by Daniel Cipriano RAMAN, RN Outcome: Progressing 12/27/2024 0552 by Daniel Cipriano RAMAN, RN Outcome: Progressing Goal: Respiratory complications will improve 12/27/2024 0553 by Daniel Cipriano RAMAN, RN Outcome:  Progressing 12/27/2024 0552 by Daniel Cipriano RAMAN, RN Outcome: Progressing Goal: Cardiovascular complication will be avoided 12/27/2024 0553 by Daniel Cipriano RAMAN, RN Outcome: Progressing 12/27/2024 0552 by Daniel Cipriano RAMAN, RN Outcome: Progressing   Problem: Activity: Goal: Risk for activity intolerance will decrease 12/27/2024 0553 by Daniel Cipriano RAMAN, RN Outcome: Progressing 12/27/2024 [phone]  by Daniel Cipriano RAMAN, RN Outcome: Progressing   Problem: Nutrition: Goal: Adequate nutrition will be maintained 12/27/2024 0553 by Daniel Cipriano RAMAN, RN Outcome: Progressing 12/27/2024 0552 by Daniel Cipriano RAMAN, RN Outcome: Progressing   Problem: Coping: Goal: Level of anxiety will decrease 12/27/2024 0553 by Daniel Cipriano RAMAN, RN Outcome: Progressing 12/27/2024 0552 by Daniel Cipriano RAMAN, RN Outcome: Progressing   Problem: Elimination: Goal: Will not experience complications related to bowel motility 12/27/2024 0553 by Daniel Cipriano RAMAN, RN Outcome: Progressing 12/27/2024 0552 by Daniel Cipriano RAMAN, RN Outcome: Progressing Goal: Will not experience complications related to urinary retention 12/27/2024 0553 by Daniel Cipriano RAMAN, RN Outcome: Progressing 12/27/2024 0552 by Daniel Cipriano RAMAN, RN Outcome: Progressing   Problem: Pain Managment: Goal: General experience of comfort will improve and/or be controlled 12/27/2024 0553 by Daniel Cipriano RAMAN, RN Outcome: Progressing 12/27/2024 0552 by Daniel Cipriano RAMAN, RN Outcome: Progressing   Problem: Safety: Goal: Ability to remain free from injury will improve 12/27/2024 0553 by Daniel Cipriano RAMAN, RN Outcome: Progressing 12/27/2024 0552 by Daniel Cipriano RAMAN, RN Outcome: Progressing   Problem: Skin Integrity: Goal: Risk for impaired skin integrity will decrease 12/27/2024 0553 by Daniel Cipriano RAMAN, RN Outcome: Progressing 12/27/2024 0552 by Daniel Cipriano RAMAN, RN Outcome: Progressing   "

## 2024-12-27 NOTE — Evaluation (Signed)
 Occupational Therapy Evaluation Patient Details Name: [name] MRN: [id number] DOB: 27-Nov-1930 Today's Date: 12/27/2024   History of Present Illness   Pt is a 89 y.o female presenting to [facility] for concerns for CVA. Pt report LE weakness and speech changes. 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 PMH: HTN, HLD, spinal stenosis     Clinical Impressions Pt admitted based on above, and was seen based on problem list below. PTA pt was independent with ADLs and IADLs. Today pt is requiring set up  to min  assist for ADLs. Functional transfers are  CGA for balance. Pt limited to step pivot to [facility] d/t c/o of dizziness with mobility, see general comments for BP measurements. Noted pt with deficits in motor planning, recall, attention, and problem solving. Based on pt's performance today, anticipate pt could reach mod I with intensive, post-acute rehab. Recommending >3 hours of skilled rehab daily. OT will continue to follow acutely to maximize functional independence.     If plan is discharge home, recommend the following:   A little help with walking and/or transfers;A little help with bathing/dressing/bathroom;Assistance with cooking/housework     Functional Status Assessment   Patient has had a recent decline in their functional status and demonstrates the ability to make significant improvements in function in a reasonable and predictable amount of time.     Equipment Recommendations   Other (comment) (Defer to next venue)     Recommendations for Other Services   Rehab consult     Precautions/Restrictions   Precautions Precautions: Fall Recall of Precautions/Restrictions: Intact Restrictions Weight Bearing Restrictions Per Provider Order: No     Mobility Bed Mobility Overal bed mobility: Needs Assistance Bed Mobility: Supine to Sit, Sit to Supine     Supine to sit: Supervision Sit to supine: Supervision   General  bed mobility comments: S for safety, assist to scoot up in bed    Transfers Overall transfer level: Needs assistance Equipment used: 1 person hand held assist Transfers: Sit to/from Stand, Bed to chair/wheelchair/BSC Sit to Stand: Contact guard assist     Step pivot transfers: Contact guard assist     General transfer comment: CGA for balance, limited to BSC transfer d/t c/o of dizziness      Balance Overall balance assessment: Needs assistance, History of Falls Sitting-balance support: No upper extremity supported, Feet supported Sitting balance-Leahy Scale: Fair     Standing balance support: Bilateral upper extremity supported, During functional activity Standing balance-Leahy Scale: Poor         ADL either performed or assessed with clinical judgement   ADL Overall ADL's : Needs assistance/impaired Eating/Feeding: Set up;Sitting   Grooming: Set up;Sitting   Upper Body Bathing: Set up;Sitting   Lower Body Bathing: Minimal assistance;Sit to/from stand   Upper Body Dressing : Set up;Sitting   Lower Body Dressing: Minimal assistance;Sit to/from stand   Toilet Transfer: Dealer Details (indicate cue type and reason): short step pivot Toileting- Clothing Manipulation and Hygiene: Contact guard assist;Sit to/from stand       Functional mobility during ADLs: Contact guard assist General ADL Comments: CGA for balance, difficulty with motor planning impairing function     Vision Baseline Vision/History: 1 Wears glasses Vision Assessment?: Yes Eye Alignment: Within Functional Limits Ocular Range of Motion: Within Functional Limits Alignment/Gaze Preference: Within Defined Limits Tracking/Visual Pursuits: Decreased smoothness of horizontal tracking (Decreased smoothness with tracking to L side) Saccades: Decreased speed of  saccadic movement Visual Fields: No apparent deficits Depth Perception: Undershoots        Praxis  Praxis: Impaired Praxis Impairment Details: Motor planning     Pertinent Vitals/Pain Pain Assessment Pain Assessment: No/denies pain     Extremity/Trunk Assessment Upper Extremity Assessment Upper Extremity Assessment: Right hand dominant;RUE deficits/detail RUE Deficits / Details: AROM, strength, sensation WFL. Decreased motor planning RUE Sensation: WNL RUE Coordination: decreased fine motor;decreased gross motor   Lower Extremity Assessment Lower Extremity Assessment: Defer to PT evaluation   Cervical / Trunk Assessment Cervical / Trunk Assessment: Normal   Communication Communication Communication: Impaired Factors Affecting Communication: Difficulty expressing self   Cognition Arousal: Alert Behavior During Therapy: Anxious Cognition: Cognition impaired       Memory impairment (select all impairments): Short-term memory Attention impairment (select first level of impairment): Selective attention Executive functioning impairment (select all impairments): Problem solving OT - Cognition Comments: Difficulty with problem solving, recall from within session, and selective attention. Potential communicative deficits impacting as well. Unable to write name and address, difficulty with word retrieval     Following commands: Intact       Cueing  General Comments   Cueing Techniques: Verbal cues;Gestural cues  BP in supine 202/83; BP in sitting 177/86; BP in standing  185/020.  Pt c/o of dizziness throughout mobility. MD present and aware           Home Living Family/patient expects to be discharged to:: Private residence Living Arrangements: Alone Available Help at Discharge: Family;Available PRN/intermittently Type of Home: House Home Access: Stairs to enter [employer] of Steps: 3 Entrance Stairs-Rails: Left Home Layout: One level     Bathroom Shower/Tub: Tub/shower unit;Walk-in shower (pt takes baths)   Bathroom Toilet: Standard     Home  Equipment: Grab bars - tub/shower;Shower seat          Prior Functioning/Environment Prior Level of Function : Independent/Modified Independent;Driving             Mobility Comments: Ind, no AD ADLs Comments: Ind    OT Problem List: Decreased activity tolerance;Impaired balance (sitting and/or standing);Decreased cognition;Decreased safety awareness;Decreased knowledge of use of DME or AE;Decreased knowledge of precautions;Impaired UE functional use   OT Treatment/Interventions: Therapeutic exercise;Self-care/ADL training;Neuromuscular education;Energy conservation;DME and/or AE instruction;Therapeutic activities;Balance training;Patient/family education      OT Goals(Current goals can be found in the care plan section)   Acute Rehab OT Goals Patient Stated Goal: To get better OT Goal Formulation: With patient Time For Goal Achievement: 01/10/25 Potential to Achieve Goals: Good   OT Frequency:  Min 2X/week       AM-PAC OT 6 Clicks Daily Activity     Outcome Measure Help from another person eating meals?: None Help from another person taking care of personal grooming?: A Little Help from another person toileting, which includes using toliet, bedpan, or urinal?: A Little Help from another person bathing (including washing, rinsing, drying)?: A Little Help from another person to put on and taking off regular upper body clothing?: A Little Help from another person to put on and taking off regular lower body clothing?: A Little 6 Click Score: 19   End of Session Equipment Utilized During Treatment: Gait belt Nurse Communication: Mobility status  Activity Tolerance: Treatment limited secondary to medical complications (Comment) (BP) Patient left: in bed;with call bell/phone within reach;with bed alarm set;with family/visitor present  OT Visit Diagnosis: Unsteadiness on feet (R26.81);Other abnormalities of gait and mobility (R26.89);Muscle weakness (generalized)  (M62.81);Apraxia (R48.2)  Time: 8672-8597 OT Time Calculation (min): 35 min Charges:  OT General Charges $OT Visit: 1 Visit OT Evaluation $OT Eval Moderate Complexity: 1 Mod OT Treatments $Self Care/Home Management : 8-22 mins  Adrianne BROCKS, OT  Acute Rehabilitation Services Office [phone] Secure chat preferred   Adrianne GORMAN Savers 12/27/2024, 3:31 PM

## 2024-12-27 NOTE — Progress Notes (Signed)
 EEG complete - results pending

## 2024-12-27 NOTE — Procedures (Signed)
 Patient Name: [name]  MRN: [id number]  Epilepsy Attending: Arlin MALVA Krebs  Referring Physician/Provider: Jerri Pfeiffer, MD  Date: 12/27/2024  Duration: 23.26 mins  Patient history: 89 y.o. female with chief complaint of right-sided weakness numbness. EEG to evaluate for seizure  Level of alertness: Awake  AEDs during EEG study: None  Technical aspects: This EEG study was done with scalp electrodes positioned according to the 10-20 International system of electrode placement. Electrical activity was reviewed with band pass filter of 1-70Hz , sensitivity of 7 uV/mm, display speed of 35mm/sec with a 60Hz  notched filter applied as appropriate. EEG data were recorded continuously and digitally stored.  Video monitoring was available and reviewed as appropriate.  Description: The posterior dominant rhythm consists of 8 Hz activity of moderate voltage (25-35 uV) seen predominantly in posterior head regions, symmetric and reactive to eye opening and eye closing. Hyperventilation and photic stimulation were not performed.     IMPRESSION: This study is within normal limits. No seizures or epileptiform discharges were seen throughout the recording.  A normal interictal EEG does not exclude the diagnosis of epilepsy.   [name] O Rital Cavey

## 2024-12-27 NOTE — Progress Notes (Signed)
 Echocardiogram 2D Echocardiogram has been performed.  [name] 12/27/2024, 4:27 PM

## 2024-12-28 LAB — BASIC METABOLIC PANEL WITH GFR
Anion gap: 12 (ref 5–15)
BUN: 16 mg/dL (ref 8–23)
CO2: 20 mmol/L — ABNORMAL LOW (ref 22–32)
Calcium: 9.3 mg/dL (ref 8.9–10.3)
Chloride: 99 mmol/L (ref 98–111)
Creatinine, Ser: 1.13 mg/dL — ABNORMAL HIGH (ref 0.44–1.00)
GFR, Estimated: 46 mL/min — ABNORMAL LOW
Glucose, Bld: 159 mg/dL — ABNORMAL HIGH (ref 70–99)
Potassium: 4.2 mmol/L (ref 3.5–5.1)
Sodium: 131 mmol/L — ABNORMAL LOW (ref 135–145)

## 2024-12-28 MED ORDER — METOPROLOL SUCCINATE ER 25 MG PO TB24
25.0000 mg | ORAL_TABLET | Freq: Every day | ORAL | Status: DC
  Administered 2024-12-28 – 2024-12-29 (×2): 25 mg via ORAL
  Filled 2024-12-28 (×2): qty 1

## 2024-12-28 MED ORDER — METOPROLOL SUCCINATE ER 50 MG PO TB24: 50.0000 mg | ORAL_TABLET | Freq: Every day | ORAL | Status: DC

## 2024-12-28 NOTE — PMR Pre-admission (Signed)
 PMR Admission Coordinator Pre-Admission Assessment  Patient: [name] is an 89 y.o., female MRN: [id number] DOB: [birth year] Height: 5' (152.4 cm) Weight: 63 kg  Insurance Information HMO:     PPO: yes     PCP:      IPA:      80/20:      OTHER:  PRIMARY: [employer]      Policy#: [id number]       Subscriber: pt CM Name: Annabella      Phone#: [phone]     Fax#: [phone] Pre-Cert#: [id number] auth for CIR from Tiffany with [employer] for admit 2/6 with next review date 2/13.  Updates due to fax listed above.        Employer:  Benefits:  Phone #: [phone]     Name:  Eff. Date: 11/23/24     Deduct: $150 ($0 met)      Out of Pocket Max: $2000 ($0 met)      Life Max:  CIR: $750/admit      SNF: 100% Outpatient: 100%     Co-Pay:  Home Health: 100%      Co-Pay:  DME: 100%     Co-Pay:  Providers:  SECONDARY:       Policy#:      Phone#:   Artist:       Phone#:   The Best Boy for patients in Inpatient Rehabilitation Facilities with attached Privacy Act Statement-Health Care Records was provided and verbally reviewed with: Patient and Family  Emergency Contact Information Contact Information     Name Relation Home Work Mobile   COTTRELL,CECIL Friend   [phone]   Brigitte, Soderberg [phone]  [phone]   Tanda Pulling Daughter [phone]  [phone]      Other Contacts   None on File     Current Medical History  Patient Admitting Diagnosis: CVA   History of Present Illness: Pt is a 89 y/o female with PMH of HTN, spinal stenosis who presented to [facility] on 2/3 as a code stroke.  She reports weakness and speech difficulty for 1 day, multiple falls at home in the last 24 hours.  In ED she was noted to be hyperkalemic with AKI, and imaging indicated acute strokes in the PCA distribution on the right.  Neurology was consulted and she was transferred to [facility] on 2/3 for further workup.  ECHO showed EF of 55-60%,  mild to moderate mitral regurgitation.  Neurology recommendations for DAPT with aspirin  and clopidogrel  x3 months, then plavix  alone.  Therapy ongoing and pt has been recommended for CIR.  Patients medical and functional status supports admission to an inpatient rehabilitation program. Due to multifactorial deficits, continued progress will require a coordinated, team-based approach with physician oversight to achieve meaningful gains in mobility, ADL independence, and safety.     Complete NIHSS TOTAL: 1  Patient's medical record from [facility] and [facility] has been reviewed by the rehabilitation admission coordinator and physician.  Past Medical History  Past Medical History:  Diagnosis Date   Anxiety    Essential hypertension    GERD (gastroesophageal reflux disease)    Hyperlipidemia    Palpitations    Reportedly symptomatic PVCs    Spinal stenosis    Vaginal atrophy 04/18/2015   Vaginal itching 04/18/2015    Has the patient had major surgery during 100 days prior to admission? No  Family History   family history includes Alzheimer's disease in her maternal grandfather and mother; Dementia in her mother;  Diabetes in her maternal grandmother; Healthy in her son; Heart disease in her father; Hypertension in her paternal grandfather; Stroke in her paternal grandfather; Tuberculosis in her maternal grandmother.  Current Medications Current Medications[1]  Patients Current Diet:  Diet Order             Diet Heart Room service appropriate? Yes; Fluid consistency: Thin  Diet effective now                   Precautions / Restrictions Precautions Precautions: Fall Restrictions Weight Bearing Restrictions Per Provider Order: No   Has the patient had 2 or more falls or a fall with injury in the past year? Yes  Prior Activity Level Community (5-7x/wk): independent prior to admit, no DME, driving, managing groceries/meals/meds, etc, silver sneakers program  Prior  Functional Level Self Care: Did the patient need help bathing, dressing, using the toilet or eating? Independent  Indoor Mobility: Did the patient need assistance with walking from room to room (with or without device)? Independent  Stairs: Did the patient need assistance with internal or external stairs (with or without device)? Independent  Functional Cognition: Did the patient need help planning regular tasks such as shopping or remembering to take medications? Independent  Patient Information Are you of Hispanic, Latino/a,or Spanish origin?: A. No, not of Hispanic, Latino/a, or Spanish origin What is your race?: A. White Do you need or want an interpreter to communicate with a doctor or health care staff?: 0. No  Patient's Response To:  Health Literacy and Transportation Is the patient able to respond to health literacy and transportation needs?: Yes Health Literacy - How often do you need to have someone help you when you read instructions, pamphlets, or other written material from your doctor or pharmacy?: Never In the past 12 months, has lack of transportation kept you from medical appointments or from getting medications?: No In the past 12 months, has lack of transportation kept you from meetings, work, or from getting things needed for daily living?: No  Home Assistive Devices / Equipment Home Equipment: Grab bars - tub/shower, Shower seat  Prior Device Use: Indicate devices/aids used by the patient prior to current illness, exacerbation or injury? None of the above  Current Functional Level Cognition  Orientation Level: Oriented X4    Extremity Assessment (includes Sensation/Coordination)  Upper Extremity Assessment: Right hand dominant, RUE deficits/detail RUE Deficits / Details: AROM, strength, sensation WFL. Decreased motor planning RUE Sensation: WNL RUE Coordination: decreased fine motor, decreased gross motor  Lower Extremity Assessment: Defer to PT  evaluation RLE Deficits / Details: AROM WFL; hip flexion 2+, knee extension 3+, ankle DF 5 RLE Sensation: WNL    ADLs  Overall ADL's : Needs assistance/impaired Eating/Feeding: Set up, Sitting Grooming: Set up, Sitting Upper Body Bathing: Set up, Sitting Lower Body Bathing: Minimal assistance, Sit to/from stand Upper Body Dressing : Set up, Sitting Lower Body Dressing: Minimal assistance, Sit to/from stand Toilet Transfer: Contact guard assist, BSC/3in1 Toilet Transfer Details (indicate cue type and reason): short step pivot Toileting- Clothing Manipulation and Hygiene: Contact guard assist, Sit to/from stand Functional mobility during ADLs: Contact guard assist General ADL Comments: CGA for balance, difficulty with motor planning impairing function    Mobility  Overal bed mobility: Needs Assistance Bed Mobility: Supine to Sit Supine to sit: Contact guard Sit to supine: Supervision General bed mobility comments: incr time and effort to exit to her rt; near need for assist to scoot out to get feet to  floor    Transfers  Overall transfer level: Needs assistance Equipment used: Rolling walker (2 wheels) Transfers: Sit to/from Stand, Bed to chair/wheelchair/BSC Sit to Stand: Min assist Bed to/from chair/wheelchair/BSC transfer type:: Step pivot Step pivot transfers: Min assist General transfer comment: boosting and balance assist with cues for hand placement; assist to maneuver RW as turning to chair    Ambulation / Gait / Stairs / Wheelchair Mobility  Ambulation/Gait Ambulation/Gait assistance: Editor, Commissioning (Feet): 2 Feet Assistive device: Rolling walker (2 wheels) Gait Pattern/deviations: Step-to pattern, Decreased step length - right, Shuffle General Gait Details: initiated gait with pt sliding RLE to advance with pt progressing to lifting RLE and clearing foot Gait velocity interpretation: <1.31 ft/sec, indicative of household ambulator    Posture / Balance  Balance Overall balance assessment: Needs assistance, History of Falls Sitting-balance support: No upper extremity supported, Feet supported Sitting balance-Leahy Scale: Fair Standing balance support: Bilateral upper extremity supported, During functional activity Standing balance-Leahy Scale: Poor    Special considerations/life events  na   Previous Home Environment (from acute therapy documentation) Living Arrangements: Alone Available Help at Discharge: Family, Available PRN/intermittently Type of Home: House Home Layout: One level Home Access: Stairs to enter Entrance Stairs-Rails: Left Entrance Stairs-Number of Steps: 3 Bathroom Shower/Tub: Tub/shower unit, Walk-in shower (pt takes baths) Firefighter: Standard Home Care Services: No  Discharge Living Setting Plans for Discharge Living Setting: Patient's home Type of Home at Discharge: House Discharge Home Layout: One level Discharge Home Access: Stairs to enter Entrance Stairs-Rails: Left Entrance Stairs-Number of Steps: 2 from the side Discharge Bathroom Shower/Tub: Tub/shower unit, Walk-in shower (pt takes baths) Discharge Bathroom Toilet: Standard Discharge Bathroom Accessibility: Yes How Accessible: Accessible via walker Does the patient have any problems obtaining your medications?: No  Social/Family/Support Systems Anticipated Caregiver: son, Dann is primary contact Anticipated Caregiver's Contact Information: [phone] Ability/Limitations of Caregiver: lives in the [place] Caregiver Availability: 24/7 Discharge Plan Discussed with Primary Caregiver: Yes Is Caregiver In Agreement with Plan?: Yes Does Caregiver/Family have Issues with Lodging/Transportation while Pt is in Rehab?: No  Goals Patient/Family Goal for Rehab: PT/OT/SLP supervision to mod I Expected length of stay: 5-7 days Additional Information: Discharge plan: will return to her home with private caregivers Pt/Family Agrees to Admission  and willing to participate: Yes Program Orientation Provided & Reviewed with Pt/Caregiver Including Roles  & Responsibilities: Yes  Decrease burden of Care through IP rehab admission: n/a  Possible need for SNF placement upon discharge: not anticipated, plan for discharge home and family can hire 24/7 caregivers if needed.   Patient Condition: This patient's condition remains as documented in the consult dated 12/28/24, in which the Rehabilitation Physician determined and documented that the patient's condition is appropriate for intensive rehabilitative care in an inpatient rehabilitation facility. Will admit to inpatient rehab today.  Preadmission Screen Completed By:  Reche FORBES Lowers, PT, DPT 12/28/2024 2:39 PM ______________________________________________________________________   Discussed status with Dr. Babs on 12/29/24  at 1:52 PM  and received approval for admission today.  Admission Coordinator:  Caitlin E Warren, PT, DPT time 1:52 PM Pattricia 12/29/24    Assessment/Plan: Diagnosis: CVA Does the need for close, 24 hr/day Medical supervision in concert with the patient's rehab needs make it unreasonable for this patient to be served in a less intensive setting? Yes Co-Morbidities requiring supervision/potential complications: HTN, anxiety Due to bladder management, bowel management, safety, skin/wound care, disease management, medication administration, pain management, and patient education, does the patient require 24  hr/day rehab nursing? Yes Does the patient require coordinated care of a physician, rehab nurse, PT, OT, and SLP to address physical and functional deficits in the context of the above medical diagnosis(es)? Yes Addressing deficits in the following areas: balance, endurance, locomotion, strength, transferring, bowel/bladder control, bathing, dressing, feeding, grooming, toileting, cognition, speech, and psychosocial support Can the patient actively participate in an  intensive therapy program of at least 3 hrs of therapy 5 days a week? Yes The potential for patient to make measurable gains while on inpatient rehab is excellent Anticipated functional outcomes upon discharge from inpatient rehab: modified independent and supervision PT, modified independent and supervision OT, modified independent and supervision SLP Estimated rehab length of stay to reach the above functional goals is: 5-7 days Anticipated discharge destination: Home 10. Overall Rehab/Functional Prognosis: excellent   MD Signature: Arthea IVAR Gunther, MD, [facility] Physical Medicine & Rehabilitation Medical Director Rehabilitation Services 12/29/2024      [1]  Current Facility-Administered Medications:    acetaminophen  (TYLENOL ) tablet 650 mg, 650 mg, Oral, Q4H PRN, 650 mg at 12/28/24 1047 **OR** acetaminophen  (TYLENOL ) 160 MG/5ML solution 650 mg, 650 mg, Per Tube, Q4H PRN **OR** acetaminophen  (TYLENOL ) suppository 650 mg, 650 mg, Rectal, Q4H PRN, Ricky Fines, MD   artificial tears ophthalmic solution 1 drop, 1 drop, Both Eyes, BID, Ricky Fines, MD, 1 drop at 12/28/24 1039   aspirin  EC tablet 81 mg, 81 mg, Oral, Daily, Ricky Fines, MD, 81 mg at 12/28/24 1039   atorvastatin  (LIPITOR) tablet 40 mg, 40 mg, Oral, Daily, Jerri Pfeiffer, MD, 40 mg at 12/28/24 1039   clopidogrel  (PLAVIX ) tablet 75 mg, 75 mg, Oral, Daily, Ricky Fines, MD, 75 mg at 12/28/24 1039   heparin  injection 5,000 Units, 5,000 Units, Subcutaneous, Q8H, Ricky Fines, MD, 5,000 Units at 12/28/24 1403   hydrALAZINE  (APRESOLINE ) injection 5 mg, 5 mg, Intravenous, Q8H PRN, Hall, Carole N, DO, 5 mg at 12/28/24 9165   LORazepam  (ATIVAN ) tablet 0.5 mg, 0.5 mg, Oral, Q8H PRN, Ricky Fines, MD, 0.5 mg at 12/27/24 2156   pantoprazole  (PROTONIX ) EC tablet 40 mg, 40 mg, Oral, Daily, Ricky Fines, MD, 40 mg at 12/28/24 1039   senna-docusate (Senokot-S) tablet 1 tablet, 1 tablet, Oral, QHS PRN, Ricky Fines,  MD

## 2024-12-28 NOTE — Evaluation (Signed)
 Speech Language Pathology Evaluation Patient Details Name: [name] MRN: [id number] DOB: July 09, 1931 Today's Date: 12/28/2024 Time: 8479-8465 SLP Time Calculation (min) (ACUTE ONLY): 14 min  Problem List:  Patient Active Problem List   Diagnosis Date Noted   Acute ischemic stroke (HCC) 12/26/2024   Vaginal itching 04/18/2015   Vaginal atrophy 04/18/2015   Essential hypertension 10/23/2014   Palpitations 10/23/2014   Symptomatic PVCs 10/23/2014   GERD 06/22/2008   Past Medical History:  Past Medical History:  Diagnosis Date   Anxiety    Essential hypertension    GERD (gastroesophageal reflux disease)    Hyperlipidemia    Palpitations    Reportedly symptomatic PVCs    Spinal stenosis    Vaginal atrophy 04/18/2015   Vaginal itching 04/18/2015   Past Surgical History:  Past Surgical History:  Procedure Laterality Date   Bilateral breast cyst  1964   CATARACT EXTRACTION W/PHACO Right 03/02/2016   Procedure: CATARACT EXTRACTION PHACO AND INTRAOCULAR LENS PLACEMENT (IOC);  Surgeon: Cherene Mania, MD;  Location: [facility];  Service: Ophthalmology;  Laterality: Right;  CDE 10.57   Caturact Surgery  2010   CHOLECYSTECTOMY     04/07/2011   COLONOSCOPY     11/04/2010 Dr.Rourk   DILATION AND CURETTAGE OF UTERUS  1972   UPPER GASTROINTESTINAL ENDOSCOPY     HPI:  [name] is a 89 y.o. R handed  female  with hx of HTN, HLD no DM (A1c 5.3) and diarrhea for 2 days prior to d/c, who's legs went out from beneath her and she fell. She was brought to hospital due to R hemiparesis and aphasia Sx's.   She was dx'd with  L acute PCA stroke.   Assessment / Plan / Recommendation Clinical Impression  Pt demonstrates no significant language impariment during administration of [place] Aphasia Bedside Form. Pt scored WNL and was fully fluent during visit. Pt and son both reported improvement since yesterday. Pt does feel her processing of information is a little slower than normal and is also  having trouble with handwriting (right handed). No acute SLP f/u, defer futher assessment to next level of care if desired by pt.    SLP Assessment  SLP Recommendation/Assessment: All further Speech Language Pathology needs can be addressed in the next venue of care SLP Visit Diagnosis: Aphasia (R47.01)     Assistance Recommended at Discharge     Functional Status Assessment    Frequency and Duration           SLP Evaluation Cognition  Overall Cognitive Status: Within Functional Limits for tasks assessed       Comprehension       Expression Verbal Expression Overall Verbal Expression: Appears within functional limits for tasks assessed   Oral / Motor  Oral Motor/Sensory Function Overall Oral Motor/Sensory Function: Within functional limits Motor Speech Overall Motor Speech: Appears within functional limits for tasks assessed            Kalima Saylor, Consuelo Fitch 12/28/2024, 3:47 PM

## 2024-12-28 NOTE — Progress Notes (Signed)
 Inpatient Rehab Coordinator Note:  I spoke with patient and her over the phone to discuss CIR recommendations and goals/expectations of CIR stay.  We reviewed 3 hrs/day of therapy, physician follow up, and average length of stay 2 weeks (dependent upon progress) with goals of supervision to mod I.  Pt's son reports they can hire caregivers to provided expected level of care, as they have in the past.  They confirm [facility] as payor and I reviewed prior auth process. I will start auth today.   Reche Lowers, PT, DPT Admissions Coordinator [phone] 12/28/24 1:17 PM

## 2024-12-28 NOTE — Progress Notes (Signed)
 Physical Therapy Treatment Patient Details Name: [name] MRN: [id number] DOB: 25-Dec-1930 Today's Date: 12/28/2024   History of Present Illness Pt is a 89 y.o female presenting to [facility] 2/3 for concerns for CVA. Pt report LE weakness and speech changes. 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 PMH: HTN, HLD, spinal stenosis    PT Comments  Patient eager to work with PT. Reports she has dizziness at rest heavy headedness. As mobilizing she reports vision darkening and lightheadedness. See below for orthostatic BPs (+). Able to progress from bed to chair and BP slow to recover (required elevating legs). Overall, needs instruction and assist with using RW and having difficulty advancing RLE as stepping to chair (on her rt). MD, RN notified re: BP changes with activity.      12/28/24 1001  Vital Signs  BP Location Left Arm  BP Method Automatic  Patient Position (if appropriate) Orthostatic Vitals  Orthostatic Lying   BP- Lying 141/65  Pulse- Lying 99  Orthostatic Sitting  BP- Sitting 145/71  Pulse- Sitting 102  Orthostatic Standing at 0 minutes  BP- Standing at 0 minutes 123/66 (actually completed in sitting)  Pulse- Standing at 0 minutes 105     If plan is discharge home, recommend the following: A little help with walking and/or transfers;A little help with bathing/dressing/bathroom;Assistance with cooking/housework;Assist for transportation;Help with stairs or ramp for entrance   Can travel by private vehicle        Equipment Recommendations  Rolling walker (2 wheels)    Recommendations for Other Services       Precautions / Restrictions Precautions Precautions: Fall Recall of Precautions/Restrictions: Intact Restrictions Weight Bearing Restrictions Per Provider Order: No     Mobility  Bed Mobility Overal bed mobility: Needs Assistance Bed Mobility: Supine to Sit     Supine to sit: Contact guard     General bed  mobility comments: incr time and effort to exit to her rt; near need for assist to scoot out to get feet to floor    Transfers Overall transfer level: Needs assistance Equipment used: Rolling walker (2 wheels) Transfers: Sit to/from Stand, Bed to chair/wheelchair/BSC Sit to Stand: Min assist   Step pivot transfers: Min assist       General transfer comment: boosting and balance assist with cues for hand placement; assist to maneuver RW as turning to chair    Ambulation/Gait Ambulation/Gait assistance: Min assist Gait Distance (Feet): 2 Feet Assistive device: Rolling walker (2 wheels) Gait Pattern/deviations: Step-to pattern, Decreased step length - right, Shuffle   Gait velocity interpretation: <1.31 ft/sec, indicative of household ambulator   General Gait Details: initiated gait with pt sliding RLE to advance with pt progressing to lifting RLE and clearing foot   Stairs             Wheelchair Mobility     Tilt Bed    Modified Rankin (Stroke Patients Only) Modified Rankin (Stroke Patients Only) Pre-Morbid Rankin Score: No symptoms Modified Rankin: Moderately severe disability     Balance Overall balance assessment: Needs assistance, History of Falls Sitting-balance support: No upper extremity supported, Feet supported Sitting balance-Leahy Scale: Fair     Standing balance support: Bilateral upper extremity supported, During functional activity Standing balance-Leahy Scale: Poor                              Communication Communication Communication: Impaired Factors Affecting Communication:  Difficulty expressing self  Cognition Arousal: Alert Behavior During Therapy: Anxious                             Following commands: Intact      Cueing Cueing Techniques: Verbal cues, Gestural cues  Exercises General Exercises - Lower Extremity Long Arc Quad: AROM, Right, 10 reps Heel Slides: AROM, Right, Other reps (comment) (3) Hip  ABduction/ADduction: AROM, Right, Other reps (comment) (3) Heel Raises: AROM, Both, 10 reps, Seated    General Comments General comments (skin integrity, edema, etc.): See flowsheet for +orthostatics      Pertinent Vitals/Pain Pain Assessment Pain Assessment: No/denies pain    Home Living                          Prior Function            PT Goals (current goals can now be found in the care plan section) Acute Rehab PT Goals Patient Stated Goal: return home Time For Goal Achievement: 01/10/25 Potential to Achieve Goals: Good Progress towards PT goals: Progressing toward goals    Frequency    Min 3X/week      PT Plan      Co-evaluation              AM-PAC PT 6 Clicks Mobility   Outcome Measure  Help needed turning from your back to your side while in a flat bed without using bedrails?: None Help needed moving from lying on your back to sitting on the side of a flat bed without using bedrails?: A Little Help needed moving to and from a bed to a chair (including a wheelchair)?: A Little Help needed standing up from a chair using your arms (e.g., wheelchair or bedside chair)?: A Little Help needed to walk in hospital room?: Total (<20 ft) Help needed climbing 3-5 steps with a railing? : Total 6 Click Score: 15    End of Session Equipment Utilized During Treatment: Gait belt Activity Tolerance: Treatment limited secondary to medical complications (Comment) (lightheadedness) Patient left: with call bell/phone within reach;with family/visitor present;in chair;with chair alarm set Nurse Communication: Mobility status;Other (comment) (lightheadedness; +orthostatics) PT Visit Diagnosis: Other abnormalities of gait and mobility (R26.89);Muscle weakness (generalized) (M62.81);History of falling (Z91.81);Dizziness and giddiness (R42)     Time: 9078-9043 PT Time Calculation (min) (ACUTE ONLY): 35 min  Charges:    $Therapeutic Exercise: 8-22  mins $Therapeutic Activity: 8-22 mins PT General Charges $$ ACUTE PT VISIT: 1 Visit                      Nicole RAMAN, PT Acute Rehabilitation Services  Office [phone]    [name] 12/28/2024, 10:11 AM

## 2024-12-28 NOTE — Consult Note (Signed)
 "     Physical Medicine and Rehabilitation Consult Reason for Consult:CIR Referring Physician: Dr Madelyne   HPI: [name] is a 89 y.o. R handed  female  with hx of HTN, HLD no DM (A1c 5.3) and diarrhea for 2 days prior to d/c, who's legs went out from beneath her and she fell. She was brought to hospital due to R hemiparesis and aphasia Sx's.  She was dx'd with  L acute PCA stroke.  Currently is requiring min A with OT and min A shuffling gait  but only TWO feet-   Pt reports no pain- no HA's.  She's having OH Sx's esp when laying down and stands up- and feels dizzy/lightheaded.   LBM 2 dasy ago- was diarrhea- had for 2 days prior to admission.   Her IV's are stinging and the one of R is hurting.    Review of Systems  Constitutional:  Positive for malaise/fatigue.  HENT:  Positive for hearing loss. Negative for congestion.   Eyes: Negative.  Negative for blurred vision and double vision.  Respiratory:  Negative for cough, shortness of breath and wheezing.   Cardiovascular:  Negative for chest pain and leg swelling.  Gastrointestinal:  Negative for abdominal pain, heartburn and nausea.       Diarrhea for 2 days up til 2/3  Genitourinary:  Negative for dysuria and frequency.  Musculoskeletal: Negative.   Skin: Negative.   Neurological:  Positive for dizziness, speech change, focal weakness and weakness. Negative for sensory change, seizures and headaches.       Fine motor movement significant impaired on R side  Endo/Heme/Allergies: Negative.   Psychiatric/Behavioral:  The patient is nervous/anxious.   All other systems reviewed and are negative.  Past Medical History:  Diagnosis Date   Anxiety    Essential hypertension    GERD (gastroesophageal reflux disease)    Hyperlipidemia    Palpitations    Reportedly symptomatic PVCs    Spinal stenosis    Vaginal atrophy 04/18/2015   Vaginal itching 04/18/2015   Past Surgical History:  Procedure Laterality Date    Bilateral breast cyst  1964   CATARACT EXTRACTION W/PHACO Right 03/02/2016   Procedure: CATARACT EXTRACTION PHACO AND INTRAOCULAR LENS PLACEMENT (IOC);  Surgeon: Cherene Mania, MD;  Location: [facility];  Service: Ophthalmology;  Laterality: Right;  CDE 10.57   Caturact Surgery  2010   CHOLECYSTECTOMY     04/07/2011   COLONOSCOPY     11/04/2010 Dr.Rourk   DILATION AND CURETTAGE OF UTERUS  1972   UPPER GASTROINTESTINAL ENDOSCOPY     Family History  Problem Relation Age of Onset   Healthy Son    Heart disease Father    Dementia Mother    Alzheimer's disease Mother    Tuberculosis Maternal Grandmother    Diabetes Maternal Grandmother    Alzheimer's disease Maternal Grandfather    Hypertension Paternal Grandfather    Stroke Paternal Grandfather    Social History:  reports that she has never smoked. She has never used smokeless tobacco. She reports current alcohol  use. She reports that she does not use drugs. Allergies: Allergies[1] Medications Prior to Admission  Medication Sig Dispense Refill   acetaminophen  (TYLENOL ) 500 MG tablet Take 1,000 mg by mouth as needed for mild pain or fever.      aspirin  81 MG tablet Take 81 mg by mouth daily.       chlorthalidone (HYGROTON) 25 MG tablet Take 25 mg by mouth every morning.  docusate sodium  (COLACE) 100 MG capsule Take 100 mg by mouth daily as needed for mild constipation.     LORazepam  (ATIVAN ) 1 MG tablet Take 1 mg by mouth at bedtime.     metoprolol  succinate (TOPROL -XL) 50 MG 24 hr tablet Take 50 mg by mouth daily. Take with or immediately following a meal.     pantoprazole  (PROTONIX ) 40 MG tablet Take 40 mg by mouth at bedtime as needed.     Polyethyl Glycol-Propyl Glycol (SYSTANE ULTRA OP) Place 1 drop into both eyes in the morning and at bedtime.     spironolactone  (ALDACTONE ) 25 MG tablet Take 25 mg by mouth daily.     traMADol (ULTRAM) 50 MG tablet Take 50 mg by mouth daily as needed for severe pain.     verapamil (CALAN-SR) 240 MG CR  tablet Take 240 mg by mouth at bedtime.      Home: Home Living Family/patient expects to be discharged to:: Private residence Living Arrangements: Alone Available Help at Discharge: Family, Available PRN/intermittently Type of Home: House Home Access: Stairs to enter [employer] of Steps: 3 Entrance Stairs-Rails: Left Home Layout: One level Bathroom Shower/Tub: Tub/shower unit, Walk-in shower (pt takes baths) Firefighter: Standard Home Equipment: Grab bars - tub/shower, Shower seat  Functional History: Prior Function Prior Level of Function : Independent/Modified Independent, Driving Mobility Comments: Ind, no AD ADLs Comments: Ind Functional Status:  Mobility: Bed Mobility Overal bed mobility: Needs Assistance Bed Mobility: Supine to Sit Supine to sit: Contact guard Sit to supine: Supervision General bed mobility comments: incr time and effort to exit to her rt; near need for assist to scoot out to get feet to floor Transfers Overall transfer level: Needs assistance Equipment used: Rolling walker (2 wheels) Transfers: Sit to/from Stand, Bed to chair/wheelchair/BSC Sit to Stand: Min assist Bed to/from chair/wheelchair/BSC transfer type:: Step pivot Step pivot transfers: Min assist General transfer comment: boosting and balance assist with cues for hand placement; assist to maneuver RW as turning to chair Ambulation/Gait Ambulation/Gait assistance: Min assist Gait Distance (Feet): 2 Feet Assistive device: Rolling walker (2 wheels) Gait Pattern/deviations: Step-to pattern, Decreased step length - right, Shuffle General Gait Details: initiated gait with pt sliding RLE to advance with pt progressing to lifting RLE and clearing foot Gait velocity interpretation: <1.31 ft/sec, indicative of household ambulator    ADL: ADL Overall ADL's : Needs assistance/impaired Eating/Feeding: Set up, Sitting Grooming: Set up, Sitting Upper Body Bathing: Set up,  Sitting Lower Body Bathing: Minimal assistance, Sit to/from stand Upper Body Dressing : Set up, Sitting Lower Body Dressing: Minimal assistance, Sit to/from stand Toilet Transfer: Contact guard assist, BSC/3in1 Toilet Transfer Details (indicate cue type and reason): short step pivot Toileting- Clothing Manipulation and Hygiene: Contact guard assist, Sit to/from stand Functional mobility during ADLs: Contact guard assist General ADL Comments: CGA for balance, difficulty with motor planning impairing function  Cognition: Cognition Orientation Level: Oriented X4 Cognition Arousal: Alert Behavior During Therapy: Anxious  Blood pressure (!) 146/77, pulse 92, temperature 97.7 F (36.5 C), temperature source Oral, resp. rate 18, height 5' (1.524 m), weight 63 kg, SpO2 97%. Physical Exam Vitals and nursing note reviewed. Exam conducted with a chaperone present.  Constitutional:      General: She is not in acute distress.    Appearance: Normal appearance. She is obese.     Comments: Pt awake, alert, appropriate, sitting up in bedside chair; her son, daughter in law and bonus daughter at bedside, NAD  HENT:  Head: Normocephalic and atraumatic.     Comments: No facial droop Tongue midline Facial sensation intact    Right Ear: External ear normal.     Left Ear: External ear normal.     Nose: Nose normal. No congestion.     Mouth/Throat:     Mouth: Mucous membranes are dry.     Pharynx: Oropharynx is clear. No oropharyngeal exudate.  Eyes:     General:        Right eye: No discharge.        Left eye: No discharge.     Extraocular Movements: Extraocular movements intact.  Cardiovascular:     Rate and Rhythm: Normal rate and regular rhythm.     Heart sounds: Normal heart sounds. No murmur heard.    No gallop.     Comments: Rate in 90's Pulmonary:     Effort: Pulmonary effort is normal. No respiratory distress.     Breath sounds: Normal breath sounds. No wheezing, rhonchi or  rales.  Abdominal:     General: Bowel sounds are normal. There is no distension.     Palpations: Abdomen is soft.     Tenderness: There is no abdominal tenderness.  Musculoskeletal:     Cervical back: Neck supple. No tenderness.     Comments: RUE deltoid, biceps, triceps 5-/5; WE 4+/5; Grip and FA 4/5 Impaired fine motor movements seen LUE- 5/5 RLE- HF 4+/5; otherwise 5-/5-  LLE- 5/5  Skin:    General: Skin is warm and dry.     Comments: IV R AC fossa- looks a little bloody L forearm IV- looks OK- per pt both stinging  Neurological:     Mental Status: She is alert.     Comments: Mild expressive aphasia noted- with a few word substitutions heard Intact to light touch in all 4 extremities Ox4- but needed a few cues to tell me which hospital   Psychiatric:     Comments: Somewhat anxious about family     Results for orders placed or performed during the hospital encounter of 12/26/24 (from the past 24 hours)  Basic metabolic panel     Status: Abnormal   Collection Time: 12/28/24  9:29 AM  Result Value Ref Range   Sodium 131 (L) 135 - 145 mmol/L   Potassium 4.2 3.5 - 5.1 mmol/L   Chloride 99 98 - 111 mmol/L   CO2 20 (L) 22 - 32 mmol/L   Glucose, Bld 159 (H) 70 - 99 mg/dL   BUN 16 8 - 23 mg/dL   Creatinine, Ser 8.86 (H) 0.44 - 1.00 mg/dL   Calcium  9.3 8.9 - 10.3 mg/dL   GFR, Estimated 46 (L) >60 mL/min   Anion gap 12 5 - 15   VAS US  CAROTID Result Date: 12/27/2024 Carotid Arterial Duplex Study Patient Name:  CHRISTELL STEINMILLER  Date of Exam:   12/27/2024 Medical Rec #: [id number]         Accession #:    [id number] Date of Birth: 21-Jul-1931          Patient Gender: F Patient Age:   89 years Exam Location:  [facility] Procedure:      VAS US  CAROTID Referring Phys: ARY XU --------------------------------------------------------------------------------  Indications:       CVA. Risk Factors:      Hypertension, hyperlipidemia, no history of smoking. Comparison Study:  No previous  exams Performing Technologist: Jody Hill RVT, RDMS  Examination Guidelines: A complete evaluation includes B-mode imaging, spectral Doppler,  color Doppler, and power Doppler as needed of all accessible portions of each vessel. Bilateral testing is considered an integral part of a complete examination. Limited examinations for reoccurring indications may be performed as noted.  Right Carotid Findings: +----------+--------+--------+--------+------------------+--------+           PSV cm/sEDV cm/sStenosisPlaque DescriptionComments +----------+--------+--------+--------+------------------+--------+ CCA Prox  78      16                                         +----------+--------+--------+--------+------------------+--------+ CCA Distal60      13                                         +----------+--------+--------+--------+------------------+--------+ ICA Prox  51      12              calcific                   +----------+--------+--------+--------+------------------+--------+ ICA Distal69      21                                         +----------+--------+--------+--------+------------------+--------+ ECA       77      9                                          +----------+--------+--------+--------+------------------+--------+ +----------+--------+-------+----------------+-------------------+           PSV cm/sEDV cmsDescribe        Arm Pressure (mmHG) +----------+--------+-------+----------------+-------------------+ [id number]             Multiphasic, WNL                    +----------+--------+-------+----------------+-------------------+ +---------+--------+--+--------+--+---------+ VertebralPSV cm/s62EDV cm/s17Antegrade +---------+--------+--+--------+--+---------+  Left Carotid Findings: +----------+--------+--------+--------+------------------+------------------+           PSV cm/sEDV cm/sStenosisPlaque DescriptionComments            +----------+--------+--------+--------+------------------+------------------+ CCA Prox  76      15                                                   +----------+--------+--------+--------+------------------+------------------+ CCA Distal58      12                                intimal thickening +----------+--------+--------+--------+------------------+------------------+ ICA Prox  57      18                                                   +----------+--------+--------+--------+------------------+------------------+ ICA Distal60      18                                                   +----------+--------+--------+--------+------------------+------------------+  ECA       85      8                                                    +----------+--------+--------+--------+------------------+------------------+ +----------+--------+--------+----------------+-------------------+           PSV cm/sEDV cm/sDescribe        Arm Pressure (mmHG) +----------+--------+--------+----------------+-------------------+ [id number]             Multiphasic, WNL                    +----------+--------+--------+----------------+-------------------+ +---------+--------+--+--------+--+---------+ VertebralPSV cm/s64EDV cm/s15Antegrade +---------+--------+--+--------+--+---------+   Summary: Right Carotid: The extracranial vessels were near-normal with only minimal wall                thickening or plaque. Left Carotid: The extracranial vessels were near-normal with only minimal wall               thickening or plaque. Vertebrals:  Bilateral vertebral arteries demonstrate antegrade flow. Subclavians: Normal flow hemodynamics were seen in bilateral subclavian              arteries. *See table(s) above for measurements and observations.  Electronically signed by Lonni Gaskins MD on 12/27/2024 at 6:38:32 PM.    Final    ECHOCARDIOGRAM COMPLETE Result Date: 12/27/2024     ECHOCARDIOGRAM REPORT   Patient Name:   SACRED ROA Date of Exam: 12/27/2024 Medical Rec #:  [id number]        Height:       60.0 in Accession #:    [id number]       Weight:       138.9 lb Date of Birth:  [birth year]         BSA:          1.599 m Patient Age:    89 years         BP:           166/82 mmHg Patient Gender: F                HR:           78 bpm. Exam Location:  Inpatient Procedure: 2D Echo, Cardiac Doppler and Color Doppler (Both Spectral and Color            Flow Doppler were utilized during procedure). Indications:    Stroke  History:        Patient has no prior history of Echocardiogram examinations,                 most recent 03/22/2020. Stroke, Arrythmias:PVC; Risk                 Factors:Hypertension and Dyslipidemia.  Sonographer:    Juliene Rucks Referring Phys: [phone] CARLOS MADERA IMPRESSIONS  1. Left ventricular ejection fraction, by estimation, is 55 to 60%. The left ventricle has normal function. The left ventricle has no regional wall motion abnormalities. Left ventricular diastolic parameters were normal.  2. Right ventricular systolic function is normal. The right ventricular size is normal. Tricuspid regurgitation signal is inadequate for assessing PA pressure.  3. The mitral valve is normal in structure. Mild to moderate mitral valve regurgitation. No evidence of mitral stenosis.  4. The aortic valve is normal in structure. Aortic valve regurgitation is not visualized. No aortic  stenosis is present.  5. The inferior vena cava is normal in size with greater than 50% respiratory variability, suggesting right atrial pressure of 3 mmHg. FINDINGS  Left Ventricle: Left ventricular ejection fraction, by estimation, is 55 to 60%. The left ventricle has normal function. The left ventricle has no regional wall motion abnormalities. The left ventricular internal cavity size was normal in size. There is  no left ventricular hypertrophy. Left ventricular diastolic parameters were normal. Right  Ventricle: The right ventricular size is normal. No increase in right ventricular wall thickness. Right ventricular systolic function is normal. Tricuspid regurgitation signal is inadequate for assessing PA pressure. Left Atrium: Left atrial size was normal in size. Right Atrium: Right atrial size was normal in size. Pericardium: There is no evidence of pericardial effusion. Presence of epicardial fat layer. Mitral Valve: The mitral valve is normal in structure. Mild to moderate mitral valve regurgitation. No evidence of mitral valve stenosis. Tricuspid Valve: The tricuspid valve is normal in structure. Tricuspid valve regurgitation is not demonstrated. No evidence of tricuspid stenosis. Aortic Valve: The aortic valve is normal in structure. Aortic valve regurgitation is not visualized. No aortic stenosis is present. Aortic valve mean gradient measures 3.0 mmHg. Aortic valve peak gradient measures 6.1 mmHg. Aortic valve area, by VTI measures 1.95 cm. Pulmonic Valve: The pulmonic valve was normal in structure. Pulmonic valve regurgitation is not visualized. No evidence of pulmonic stenosis. Aorta: The aortic root and ascending aorta are structurally normal, with no evidence of dilitation. Venous: The inferior vena cava is normal in size with greater than 50% respiratory variability, suggesting right atrial pressure of 3 mmHg. IAS/Shunts: No atrial level shunt detected by color flow Doppler.  LEFT VENTRICLE PLAX 2D LVIDd:         4.50 cm     Diastology LVIDs:         3.40 cm     LV e' medial:    7.33 cm/s LV PW:         0.90 cm     LV E/e' medial:  10.2 LV IVS:        1.00 cm     LV e' lateral:   12.10 cm/s LVOT diam:     1.90 cm     LV E/e' lateral: 6.2 LV SV:         55 LV SV Index:   35 LVOT Area:     2.84 cm  LV Volumes (MOD) LV vol d, MOD A2C: 68.4 ml LV vol d, MOD A4C: 68.7 ml LV vol s, MOD A2C: 32.7 ml LV vol s, MOD A4C: 26.1 ml LV SV MOD A2C:     35.7 ml LV SV MOD A4C:     68.7 ml LV SV MOD BP:      39.8 ml  RIGHT VENTRICLE RV Basal diam:  3.10 cm RV Mid diam:    2.50 cm RV S prime:     15.70 cm/s TAPSE (M-mode): 2.1 cm LEFT ATRIUM             Index        RIGHT ATRIUM           Index LA diam:        2.50 cm 1.56 cm/m   RA Area:     12.70 cm LA Vol (A2C):   51.7 ml 32.34 ml/m  RA Volume:   30.50 ml  19.08 ml/m LA Vol (A4C):   41.8 ml 26.15 ml/m LA Biplane Vol:  46.8 ml 29.27 ml/m  AORTIC VALVE AV Area (Vmax):    2.02 cm AV Area (Vmean):   2.06 cm AV Area (VTI):     1.95 cm AV Vmax:           123.00 cm/s AV Vmean:          83.400 cm/s AV VTI:            0.284 m AV Peak Grad:      6.1 mmHg AV Mean Grad:      3.0 mmHg LVOT Vmax:         87.50 cm/s LVOT Vmean:        60.600 cm/s LVOT VTI:          0.195 m LVOT/AV VTI ratio: 0.69  AORTA Ao Root diam: 2.60 cm Ao Asc diam:  3.00 cm MITRAL VALVE MV Area (PHT): 4.60 cm     SHUNTS MV Decel Time: 165 msec     Systemic VTI:  0.20 m MV E velocity: 74.70 cm/s   Systemic Diam: 1.90 cm MV A velocity: 135.00 cm/s MV E/A ratio:  0.55 Kardie Tobb DO Electronically signed by Dub Huntsman DO Signature Date/Time: 12/27/2024/4:42:09 PM    Final    EEG adult Result Date: 12/27/2024 Shelton Arlin KIDD, MD     12/27/2024 10:17 AM Patient Name: TAMELA ELSAYED MRN: [id number] Epilepsy Attending: Arlin KIDD Shelton Referring Physician/Provider: Jerri Pfeiffer, MD Date: 12/27/2024 Duration: 23.26 mins Patient history: 89 y.o. female with chief complaint of right-sided weakness numbness. EEG to evaluate for seizure Level of alertness: Awake AEDs during EEG study: None Technical aspects: This EEG study was done with scalp electrodes positioned according to the 10-20 International system of electrode placement. Electrical activity was reviewed with band pass filter of 1-70Hz , sensitivity of 7 uV/mm, display speed of 72mm/sec with a 60Hz  notched filter applied as appropriate. EEG data were recorded continuously and digitally stored.  Video monitoring was available and reviewed as appropriate.  Description: The posterior dominant rhythm consists of 8 Hz activity of moderate voltage (25-35 uV) seen predominantly in posterior head regions, symmetric and reactive to eye opening and eye closing. Hyperventilation and photic stimulation were not performed.   IMPRESSION: This study is within normal limits. No seizures or epileptiform discharges were seen throughout the recording. A normal interictal EEG does not exclude the diagnosis of epilepsy. Priyanka KIDD Shelton     Assessment/Plan: Diagnosis: L PCA acute ischemic stroke Does the need for close, 24 hr/day medical supervision in concert with the patient's rehab needs make it unreasonable for this patient to be served in a less intensive setting? Yes Co-Morbidities requiring supervision/potential complications: mild aphasia based on notes/exam; Orthostatic hypotension with hx of HTN; AKI; GERD, hx of anxiety; R hemiparesis Due to bladder management, bowel management, safety, skin/wound care, disease management, medication administration, and patient education, does the patient require 24 hr/day rehab nursing? Yes Does the patient require coordinated care of a physician, rehab nurse, therapy disciplines of PT, OT and SLP to address physical and functional deficits in the context of the above medical diagnosis(es)? Yes Addressing deficits in the following areas: balance, endurance, locomotion, strength, transferring, bowel/bladder control, bathing, dressing, feeding, grooming, toileting, and speech Can the patient actively participate in an intensive therapy program of at least 3 hrs of therapy per day at least 5 days per week? Yes The potential for patient to make measurable gains while on inpatient rehab is good Anticipated functional outcomes upon discharge from inpatient  rehab are modified independent and supervision  with PT, modified independent and supervision with OT, modified independent with SLP. Estimated rehab length of stay to reach the  above functional goals is: 5-8 days Anticipated discharge destination: Home with son and paid caregivers Overall Rehab/Functional Prognosis: good  RECOMMENDATIONS: This patient's condition is appropriate for continued rehabilitative care in the following setting: CIR Patient has agreed to participate in recommended program. Yes Note that insurance prior authorization may be required for reimbursement for recommended care.  Comment:  1. Patient has L PCA stroke with R hemiparesis and fine motor deficits- we discussed using R side as much as possible to improve her movement, strength and fine motor skills   2. Patient has OH when stands up- esp from laying to stand- would benefit from TEDs or ACE wraps and possibly an abdominal binder- will wait on midodrine for now, but might be required.   3. Please remove at least 1 IV- the one in R AC fossa looks like it needs removal.  And is stinging/hurting.   4. Will d/w admissions coordinator- about how pt is a good short stay candidate due to stroke, mild expressive aphasia and OH.   5. Thank you for this consult.   6. Pt is loathe to use bowel meds since had been having diarrhea prior to admission, but LBM early Tuesday AM.   I spent a total of 67   minutes on total care today- >50% coordination of care- due to  D/w pt, family x3 at length about rehab and needs long term and short term; review of chart, labs, vitals and B/B.   Emrys Mckamie, MD 12/28/2024     [1]  Allergies Allergen Reactions   Tricyclic Antidepressants Other (See Comments)    felt like I was going to have a seizure   "

## 2024-12-28 NOTE — Plan of Care (Signed)
 Pt. Is alert, awake, and oriented. Able to follow commands and understand instruction.

## 2024-12-28 NOTE — Progress Notes (Signed)
 " PROGRESS NOTE    [name]  FMW:[id number] DOB: [birth year] DOA: 12/26/2024 PCP: Sheryle Carwin, MD   Brief Narrative: 89 year old with past medical history significant for anxiety hypertension GERD, hyperlipidemia history of PVC presented to the hospital secondary to right -sided weakness and some difficulty finding words.  Patient reports decreased oral intake and intermittent episode of diarrhea for the last 2 days.  Today of admission patient felt weak and fell, reports her leg just gave out.  Evaluation in the ED CT scan showed no acute intracranial abnormality.  Lab work consistent with AKI and hyperkalemia.  Chest x-ray no acute cardiopulmonary process, MRI showed acute PCA ischemic stroke.  Patient was transferred to [facility] for further evaluation.   Assessment & Plan:   Principal Problem:   Acute ischemic stroke (HCC)  1-Acute ischemic stroke -Presented with right side weakness and difficulty finding words - CT head: Brain atrophy and white matter microvascular ischemic changes - MRI brain: Acute infarct in the left thalamus and left temporal periventricular white matter.  Possible additional infarct in the left Hypaque-M post.  No evidence of acute large vessel occlusion. - MRI angio head: No large vessel occlusion, multiple moderate to severe stenosis right P2 segment, A3 segment.. 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 LDL 116, A1c 5.3 Currently on aspirin  and Plavix  ECHO Normal.  Follow up neurology recommendations.   Hypertension Orthostatic Hypotension.  - Discussed with neurology no need for permissive hypertension. -BP management limited by Orthostatic Hypotension and dizziness.  Hold metoprolol  for now, if BP increases will consider lower dose metoprolol , maybe 25 mg Hold spironolactone  and diuretics.  Current hydralazine  as needed ordered  AKI: - Received IV fluid Presenting with a creatinine 1.5, down to  1.1 improved Continue to hold diuretics Improved.   Hyperkalemia: Received Lokelma , resolved.   GERD: - Continue Protonix   History of Anxiety:  As needed Xanax  Hyperglycemia: No consistent with diabetes, hemoglobin A1c 5.3 A1c: 5.3  Dehydration, Hyperkalemia, Hyponatremia:  Received  IV fluid Holding diuretics.        Estimated body mass index is 27.13 kg/m as calculated from the following:   Height as of this encounter: 5' (1.524 m).   Weight as of this encounter: 63 kg.   DVT prophylaxis: Heparin  Code Status: Full code Family Communication: Son who was at bedside 2/04 Disposition Plan:  Status is: Observation The patient remains OBS appropriate and will d/c before 2 midnights.    Consultants:  Neurology  Procedures:  ECHO  Antimicrobials:    Subjective: She was symptomatic, had dizziness while working with PT, BP orthostatic positive.  She is now sitting in recliner, feels better, denies dizziness. Still having some problems with finding words.   Objective: Vitals:   12/27/24 1700 12/27/24 2035 12/28/24 0019 12/28/24 0600  BP: (!) 189/82 (!) 203/91 (!) 159/66 (!) 163/70  Pulse:  82 82 88  Resp:  18 18 18   Temp:  98.6 F (37 C) 98.7 F (37.1 C) 98 F (36.7 C)  TempSrc:  Oral Oral Oral  SpO2:  98% 96% 96%  Weight:      Height:        Intake/Output Summary (Last 24 hours) at 12/28/2024 0756 Last data filed at 12/27/2024 1744 Gross per 24 hour  Intake --  Output 300 ml  Net -300 ml   Filed Weights   12/26/24 0819  Weight: 63 kg    Examination:  General exam: NAD Respiratory system: CTA Cardiovascular system: S 1, S2 RRR Gastrointestinal system:BS present, soft, nt Central nervous system: Alert and oriented. Right LE 4.5/5   Data Reviewed: I have personally reviewed following labs and imaging studies  CBC: Recent Labs  Lab 12/26/24 0830 12/27/24 0156  WBC 6.7 5.5  NEUTROABS 4.8  --   HGB 11.5* 10.4*  HCT 34.6* 30.2*  MCV  95.8 92.9  PLT 211 203   Basic Metabolic Panel: Recent Labs  Lab 12/26/24 0830 12/27/24 0156  NA 130* 131*  K 5.4* 5.4*  CL 98 102  CO2 19* 23  GLUCOSE 106* 86  BUN 30* 22  CREATININE 1.55* 1.18*  CALCIUM  9.6 8.9  MG 1.9  --    GFR: Estimated Creatinine Clearance: 25.7 mL/min (A) (by C-G formula based on SCr of 1.18 mg/dL (H)). Liver Function Tests: Recent Labs  Lab 12/26/24 0830  AST 17  ALT 10  ALKPHOS 74  BILITOT 0.4  PROT 6.8  ALBUMIN 4.0   Recent Labs  Lab 12/26/24 0830  LIPASE 23   No results for input(s): AMMONIA in the last 168 hours. Coagulation Profile: No results for input(s): INR, PROTIME in the last 168 hours. Cardiac Enzymes: No results for input(s): CKTOTAL, CKMB, CKMBINDEX, TROPONINI in the last 168 hours. BNP (last 3 results) No results for input(s): PROBNP in the last 8760 hours. HbA1C: Recent Labs    12/27/24 0156  HGBA1C 5.3   CBG: No results for input(s): GLUCAP in the last 168 hours. Lipid Profile: Recent Labs    12/27/24 0156  CHOL 172  HDL 42  LDLCALC 116*  TRIG 66  CHOLHDL 4.1   Thyroid  Function Tests: Recent Labs    12/26/24 0830  TSH 3.090   Anemia Panel: Recent Labs    12/26/24 0830  VITAMINB12 662   Sepsis Labs: No results for input(s): PROCALCITON, LATICACIDVEN in the last 168 hours.  Recent Results (from the past 240 hours)  Resp panel by RT-PCR (RSV, Flu A&B, Covid) Anterior Nasal Swab     Status: None   Collection Time: 12/26/24  8:48 AM   Specimen: Anterior Nasal Swab  Result Value Ref Range Status   SARS Coronavirus 2 by RT PCR NEGATIVE NEGATIVE Final    Comment: (NOTE) SARS-CoV-2 target nucleic acids are NOT DETECTED.  The SARS-CoV-2 RNA is generally detectable in upper respiratory specimens during the acute phase of infection. The lowest concentration of SARS-CoV-2 viral copies this assay can detect is 138 copies/mL. A negative result does not preclude  SARS-Cov-2 infection and should not be used as the sole basis for treatment or other patient management decisions. A negative result may occur with  improper specimen collection/handling, submission of specimen other than nasopharyngeal swab, presence of viral mutation(s) within the areas targeted by this assay, and inadequate number of viral copies(<138 copies/mL). A negative result must be combined with clinical observations, patient history, and epidemiological information. The expected result is Negative.  Fact Sheet for Patients:  bloggercourse.com  Fact Sheet for Healthcare Providers:  seriousbroker.it  This test is no t yet approved or cleared by the [place]  FDA and  has been authorized for detection and/or diagnosis of SARS-CoV-2 by FDA under an Emergency Use Authorization (EUA). This EUA will remain  in effect (meaning this test can be used) for the duration of the COVID-19 declaration under Section 564(b)(1) of the Act, 21 U.S.C.section 360bbb-3(b)(1), unless the authorization is terminated  or revoked sooner.  Influenza A by PCR NEGATIVE NEGATIVE Final   Influenza B by PCR NEGATIVE NEGATIVE Final    Comment: (NOTE) The Xpert Xpress SARS-CoV-2/FLU/RSV plus assay is intended as an aid in the diagnosis of influenza from Nasopharyngeal swab specimens and should not be used as a sole basis for treatment. Nasal washings and aspirates are unacceptable for Xpert Xpress SARS-CoV-2/FLU/RSV testing.  Fact Sheet for Patients: bloggercourse.com  Fact Sheet for Healthcare Providers: seriousbroker.it  This test is not yet approved or cleared by the [place]  FDA and has been authorized for detection and/or diagnosis of SARS-CoV-2 by FDA under an Emergency Use Authorization (EUA). This EUA will remain in effect (meaning this test can be used) for the duration of  the COVID-19 declaration under Section 564(b)(1) of the Act, 21 U.S.C. section 360bbb-3(b)(1), unless the authorization is terminated or revoked.     Resp Syncytial Virus by PCR NEGATIVE NEGATIVE Final    Comment: (NOTE) Fact Sheet for Patients: bloggercourse.com  Fact Sheet for Healthcare Providers: seriousbroker.it  This test is not yet approved or cleared by the [place]  FDA and has been authorized for detection and/or diagnosis of SARS-CoV-2 by FDA under an Emergency Use Authorization (EUA). This EUA will remain in effect (meaning this test can be used) for the duration of the COVID-19 declaration under Section 564(b)(1) of the Act, 21 U.S.C. section 360bbb-3(b)(1), unless the authorization is terminated or revoked.  Performed at [facility], [address] [place], [place] [address]          Radiology Studies: VAS US  CAROTID Result Date: 12/27/2024 Carotid Arterial Duplex Study Patient Name:  ANGELLINA FERDINAND  Date of Exam:   12/27/2024 Medical Rec #: [id number]         Accession #:    [id number] Date of Birth: 23-May-1931          Patient Gender: F Patient Age:   41 years Exam Location:  [facility] Procedure:      VAS US  CAROTID Referring Phys: ARY XU --------------------------------------------------------------------------------  Indications:       CVA. Risk Factors:      Hypertension, hyperlipidemia, no history of smoking. Comparison Study:  No previous exams Performing Technologist: Jody Hill RVT, RDMS  Examination Guidelines: A complete evaluation includes B-mode imaging, spectral Doppler, color Doppler, and power Doppler as needed of all accessible portions of each vessel. Bilateral testing is considered an integral part of a complete examination. Limited examinations for reoccurring indications may be performed as noted.  Right Carotid Findings: +----------+--------+--------+--------+------------------+--------+            PSV cm/sEDV cm/sStenosisPlaque DescriptionComments +----------+--------+--------+--------+------------------+--------+ CCA Prox  78      16                                         +----------+--------+--------+--------+------------------+--------+ CCA Distal60      13                                         +----------+--------+--------+--------+------------------+--------+ ICA Prox  51      12              calcific                   +----------+--------+--------+--------+------------------+--------+ ICA Distal69      21                                         +----------+--------+--------+--------+------------------+--------+  ECA       77      9                                          +----------+--------+--------+--------+------------------+--------+ +----------+--------+-------+----------------+-------------------+           PSV cm/sEDV cmsDescribe        Arm Pressure (mmHG) +----------+--------+-------+----------------+-------------------+ [id number]             Multiphasic, WNL                    +----------+--------+-------+----------------+-------------------+ +---------+--------+--+--------+--+---------+ VertebralPSV cm/s62EDV cm/s17Antegrade +---------+--------+--+--------+--+---------+  Left Carotid Findings: +----------+--------+--------+--------+------------------+------------------+           PSV cm/sEDV cm/sStenosisPlaque DescriptionComments           +----------+--------+--------+--------+------------------+------------------+ CCA Prox  76      15                                                   +----------+--------+--------+--------+------------------+------------------+ CCA Distal58      12                                intimal thickening +----------+--------+--------+--------+------------------+------------------+ ICA Prox  57      18                                                    +----------+--------+--------+--------+------------------+------------------+ ICA Distal60      18                                                   +----------+--------+--------+--------+------------------+------------------+ ECA       85      8                                                    +----------+--------+--------+--------+------------------+------------------+ +----------+--------+--------+----------------+-------------------+           PSV cm/sEDV cm/sDescribe        Arm Pressure (mmHG) +----------+--------+--------+----------------+-------------------+ [id number]             Multiphasic, WNL                    +----------+--------+--------+----------------+-------------------+ +---------+--------+--+--------+--+---------+ VertebralPSV cm/s64EDV cm/s15Antegrade +---------+--------+--+--------+--+---------+   Summary: Right Carotid: The extracranial vessels were near-normal with only minimal wall                thickening or plaque. Left Carotid: The extracranial vessels were near-normal with only minimal wall               thickening or plaque. Vertebrals:  Bilateral vertebral arteries demonstrate antegrade flow. Subclavians: Normal flow hemodynamics were seen in bilateral subclavian              arteries. *See table(s) above  for measurements and observations.  Electronically signed by Lonni Gaskins MD on 12/27/2024 at 6:38:32 PM.    Final    ECHOCARDIOGRAM COMPLETE Result Date: 12/27/2024    ECHOCARDIOGRAM REPORT   Patient Name:   [name] Date of Exam: 12/27/2024 Medical Rec #:  [id number]        Height:       60.0 in Accession #:    [id number]       Weight:       138.9 lb Date of Birth:  May 12, 1931         BSA:          1.599 m Patient Age:    89 years         BP:           166/82 mmHg Patient Gender: F                HR:           78 bpm. Exam Location:  Inpatient Procedure: 2D Echo, Cardiac Doppler and Color Doppler (Both Spectral and Color             Flow Doppler were utilized during procedure). Indications:    Stroke  History:        Patient has no prior history of Echocardiogram examinations,                 most recent 03/22/2020. Stroke, Arrythmias:PVC; Risk                 Factors:Hypertension and Dyslipidemia.  Sonographer:    Juliene Rucks Referring Phys: [phone] CARLOS MADERA IMPRESSIONS  1. Left ventricular ejection fraction, by estimation, is 55 to 60%. The left ventricle has normal function. The left ventricle has no regional wall motion abnormalities. Left ventricular diastolic parameters were normal.  2. Right ventricular systolic function is normal. The right ventricular size is normal. Tricuspid regurgitation signal is inadequate for assessing PA pressure.  3. The mitral valve is normal in structure. Mild to moderate mitral valve regurgitation. No evidence of mitral stenosis.  4. The aortic valve is normal in structure. Aortic valve regurgitation is not visualized. No aortic stenosis is present.  5. The inferior vena cava is normal in size with greater than 50% respiratory variability, suggesting right atrial pressure of 3 mmHg. FINDINGS  Left Ventricle: Left ventricular ejection fraction, by estimation, is 55 to 60%. The left ventricle has normal function. The left ventricle has no regional wall motion abnormalities. The left ventricular internal cavity size was normal in size. There is  no left ventricular hypertrophy. Left ventricular diastolic parameters were normal. Right Ventricle: The right ventricular size is normal. No increase in right ventricular wall thickness. Right ventricular systolic function is normal. Tricuspid regurgitation signal is inadequate for assessing PA pressure. Left Atrium: Left atrial size was normal in size. Right Atrium: Right atrial size was normal in size. Pericardium: There is no evidence of pericardial effusion. Presence of epicardial fat layer. Mitral Valve: The mitral valve is normal in structure. Mild to moderate  mitral valve regurgitation. No evidence of mitral valve stenosis. Tricuspid Valve: The tricuspid valve is normal in structure. Tricuspid valve regurgitation is not demonstrated. No evidence of tricuspid stenosis. Aortic Valve: The aortic valve is normal in structure. Aortic valve regurgitation is not visualized. No aortic stenosis is present. Aortic valve mean gradient measures 3.0 mmHg. Aortic valve peak gradient measures 6.1 mmHg. Aortic valve area, by VTI measures 1.95 cm. Pulmonic Valve:  The pulmonic valve was normal in structure. Pulmonic valve regurgitation is not visualized. No evidence of pulmonic stenosis. Aorta: The aortic root and ascending aorta are structurally normal, with no evidence of dilitation. Venous: The inferior vena cava is normal in size with greater than 50% respiratory variability, suggesting right atrial pressure of 3 mmHg. IAS/Shunts: No atrial level shunt detected by color flow Doppler.  LEFT VENTRICLE PLAX 2D LVIDd:         4.50 cm     Diastology LVIDs:         3.40 cm     LV e' medial:    7.33 cm/s LV PW:         0.90 cm     LV E/e' medial:  10.2 LV IVS:        1.00 cm     LV e' lateral:   12.10 cm/s LVOT diam:     1.90 cm     LV E/e' lateral: 6.2 LV SV:         55 LV SV Index:   35 LVOT Area:     2.84 cm  LV Volumes (MOD) LV vol d, MOD A2C: 68.4 ml LV vol d, MOD A4C: 68.7 ml LV vol s, MOD A2C: 32.7 ml LV vol s, MOD A4C: 26.1 ml LV SV MOD A2C:     35.7 ml LV SV MOD A4C:     68.7 ml LV SV MOD BP:      39.8 ml RIGHT VENTRICLE RV Basal diam:  3.10 cm RV Mid diam:    2.50 cm RV S prime:     15.70 cm/s TAPSE (M-mode): 2.1 cm LEFT ATRIUM             Index        RIGHT ATRIUM           Index LA diam:        2.50 cm 1.56 cm/m   RA Area:     12.70 cm LA Vol (A2C):   51.7 ml 32.34 ml/m  RA Volume:   30.50 ml  19.08 ml/m LA Vol (A4C):   41.8 ml 26.15 ml/m LA Biplane Vol: 46.8 ml 29.27 ml/m  AORTIC VALVE AV Area (Vmax):    2.02 cm AV Area (Vmean):   2.06 cm AV Area (VTI):     1.95 cm AV  Vmax:           123.00 cm/s AV Vmean:          83.400 cm/s AV VTI:            0.284 m AV Peak Grad:      6.1 mmHg AV Mean Grad:      3.0 mmHg LVOT Vmax:         87.50 cm/s LVOT Vmean:        60.600 cm/s LVOT VTI:          0.195 m LVOT/AV VTI ratio: 0.69  AORTA Ao Root diam: 2.60 cm Ao Asc diam:  3.00 cm MITRAL VALVE MV Area (PHT): 4.60 cm     SHUNTS MV Decel Time: 165 msec     Systemic VTI:  0.20 m MV E velocity: 74.70 cm/s   Systemic Diam: 1.90 cm MV A velocity: 135.00 cm/s MV E/A ratio:  0.55 Kardie Tobb DO Electronically signed by Dub Huntsman DO Signature Date/Time: 12/27/2024/4:42:09 PM    Final    EEG adult Result Date: 12/27/2024 Shelton Arlin KIDD, MD  12/27/2024 10:17 AM Patient Name: KAMYRAH FEESER MRN: [id number] Epilepsy Attending: Arlin MALVA Krebs Referring Physician/Provider: Jerri Pfeiffer, MD Date: 12/27/2024 Duration: 23.26 mins Patient history: 89 y.o. female with chief complaint of right-sided weakness numbness. EEG to evaluate for seizure Level of alertness: Awake AEDs during EEG study: None Technical aspects: This EEG study was done with scalp electrodes positioned according to the 10-20 International system of electrode placement. Electrical activity was reviewed with band pass filter of 1-70Hz , sensitivity of 7 uV/mm, display speed of 35mm/sec with a 60Hz  notched filter applied as appropriate. EEG data were recorded continuously and digitally stored.  Video monitoring was available and reviewed as appropriate. Description: The posterior dominant rhythm consists of 8 Hz activity of moderate voltage (25-35 uV) seen predominantly in posterior head regions, symmetric and reactive to eye opening and eye closing. Hyperventilation and photic stimulation were not performed.   IMPRESSION: This study is within normal limits. No seizures or epileptiform discharges were seen throughout the recording. A normal interictal EEG does not exclude the diagnosis of epilepsy. Arlin MALVA Krebs   MR ANGIO HEAD WO  CONTRAST Result Date: 12/26/2024 EXAM: MRI BRAIN WITHOUT CONTRAST MRA HEAD WITHOUT CONTRAST 12/26/2024 11:44:27 AM TECHNIQUE: Multiplanar multisequence MRI of the brain was performed without the administration of intravenous contrast. MRA of the head was performed without contrast using time-of-flight technique. 3D postprocessing with multiplanar reformations and MIPs was performed for better evaluation of the vasculature. COMPARISON: Same day CT head. CLINICAL HISTORY: Neuro deficit, acute, stroke suspected. Acute neurological deficit; stroke suspected. FINDINGS: LIMITATIONS/ARTIFACTS: The MRI is somewhat limited by motion artifact. MRI BRAIN: BRAIN AND VENTRICLES: There is an 8 mm focus of restricted diffusion in the left thalamus compatible with acute infarct. Additional areas of restricted diffusion along the left hippocampus along with abnormal FLAIR signal intensity in this region; finding could reflect additional acute infarct versus postictal changes. Recommend correlation with clinical history and consider correlation with EEG findings if clinically indicated. T2 and FLAIR hyperintensity in the periventricular and subcortical white matter suggestive of mild chronic microvascular ischemic changes. There is mild age-related parenchymal volume loss. Chronic microhemorrhage in the left occipital lobe. No acute intracranial hemorrhage. No mass. No midline shift. No hydrocephalus. The sella is unremarkable. Normal flow voids. ORBITS: There is a 0.8 x 0.5 cm mass along the superolateral preseptal soft tissues of the left orbit which was present in 2005 and appears minimally increased in size; favor benign etiology. Bilateral lens replacement. Slight irregular contour at the posterior aspect of both globes suggestive of axial myopia. SINUSES AND MASTOIDS: Mucous retention cyst in the right maxillary sinus. No significant abnormality in the mastoids. BONES AND SOFT TISSUES: Degenerative changes in the visualized  upper cervical spine. There is at least mild spinal canal stenosis at the C4-C5 and C5-C6 levels. Normal bone marrow signal. No soft tissue abnormality. MRA HEAD: ANTERIOR CIRCULATION: The intracranial internal carotid arteries are patent bilaterally. There is mild irregularity of the carotid siphons likely related to artifact as well as atherosclerosis. There is a 3 mm inferiorly directed outpouching along the left supraclinoid ICA concerning for small aneurysm at the origin of the posterior communicating artery. There is likely an additional 2.5 mm inferiorly directed outpouching at the left ICA terminus concerning for additional aneurysm. There is short segment occlusion of the A3 segment of the right ACA. There is reconstitution of the proximal right A4 segment. No significant stenosis of the anterior cerebral arteries (other than the right A3 occlusion). No significant  stenosis of the middle cerebral arteries. POSTERIOR CIRCULATION: There is irregularity of the P1 and P2 segments of the right PCA. Multifocal moderate to severe stenosis of the right P2 segment particularly along the midportion. There is additional irregularity of the left P2 segment. There is severe stenosis of the mid and distal P2 segment of the left PCA. No significant stenosis of the basilar artery. No significant stenosis of the vertebral arteries. The superior cerebellar arteries are not well visualized due to artifact. AICA visualized bilaterally, greater on the left. Origins of the PICAs visualized bilaterally. No aneurysm (in the posterior circulation). IMPRESSION: 1. Acute infarct in the left thalamus and left temporal periventricular white matter. 2. Additional restricted diffusion and abnormal FLAIR signal in the left hippocampus, which may represent additional infarct versus postictal changes. Consider EEG correlation if clinically indicated. 3. No evidence of acute LVO. 4. Multifocal moderate to severe stenosis of the right P2  segment. Severe stenosis of the mid/distal P2 segment of the left PCA. Evaluation of the P2 and P3 segments also somewhat limited by artifact and small vessel caliber. 5. 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 No evidence of right ACA territory infarct. 6. 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 Electronically signed by: Donnice Mania MD 12/26/2024 12:29 PM EST RP Workstation: [id number]   MR BRAIN WO CONTRAST Result Date: 12/26/2024 EXAM: MRI BRAIN WITHOUT CONTRAST MRA HEAD WITHOUT CONTRAST 12/26/2024 11:44:27 AM TECHNIQUE: Multiplanar multisequence MRI of the brain was performed without the administration of intravenous contrast. MRA of the head was performed without contrast using time-of-flight technique. 3D postprocessing with multiplanar reformations and MIPs was performed for better evaluation of the vasculature. COMPARISON: Same day CT head. CLINICAL HISTORY: Neuro deficit, acute, stroke suspected. Acute neurological deficit; stroke suspected. FINDINGS: LIMITATIONS/ARTIFACTS: The MRI is somewhat limited by motion artifact. MRI BRAIN: BRAIN AND VENTRICLES: There is an 8 mm focus of restricted diffusion in the left thalamus compatible with acute infarct. Additional areas of restricted diffusion along the left hippocampus along with abnormal FLAIR signal intensity in this region; finding could reflect additional acute infarct versus postictal changes. Recommend correlation with clinical history and consider correlation with EEG findings if clinically indicated. T2 and FLAIR hyperintensity in the periventricular and subcortical white matter suggestive of mild chronic microvascular ischemic changes. There is mild age-related parenchymal volume loss. Chronic microhemorrhage in the left occipital lobe. No acute intracranial hemorrhage. No mass. No midline shift. No  hydrocephalus. The sella is unremarkable. Normal flow voids. ORBITS: There is a 0.8 x 0.5 cm mass along the superolateral preseptal soft tissues of the left orbit which was present in 2005 and appears minimally increased in size; favor benign etiology. Bilateral lens replacement. Slight irregular contour at the posterior aspect of both globes suggestive of axial myopia. SINUSES AND MASTOIDS: Mucous retention cyst in the right maxillary sinus. No significant abnormality in the mastoids. BONES AND SOFT TISSUES: Degenerative changes in the visualized upper cervical spine. There is at least mild spinal canal stenosis at the C4-C5 and C5-C6 levels. Normal bone marrow signal. No soft tissue abnormality. MRA HEAD: ANTERIOR CIRCULATION: The intracranial internal carotid arteries are patent bilaterally. There is mild irregularity of the carotid siphons likely related to artifact as well as atherosclerosis. There is a 3 mm inferiorly directed outpouching along the left supraclinoid ICA concerning for small aneurysm at the origin of  the posterior communicating artery. There is likely an additional 2.5 mm inferiorly directed outpouching at the left ICA terminus concerning for additional aneurysm. There is short segment occlusion of the A3 segment of the right ACA. There is reconstitution of the proximal right A4 segment. No significant stenosis of the anterior cerebral arteries (other than the right A3 occlusion). No significant stenosis of the middle cerebral arteries. POSTERIOR CIRCULATION: There is irregularity of the P1 and P2 segments of the right PCA. Multifocal moderate to severe stenosis of the right P2 segment particularly along the midportion. There is additional irregularity of the left P2 segment. There is severe stenosis of the mid and distal P2 segment of the left PCA. No significant stenosis of the basilar artery. No significant stenosis of the vertebral arteries. The superior cerebellar arteries are not well  visualized due to artifact. AICA visualized bilaterally, greater on the left. Origins of the PICAs visualized bilaterally. No aneurysm (in the posterior circulation). IMPRESSION: 1. Acute infarct in the left thalamus and left temporal periventricular white matter. 2. Additional restricted diffusion and abnormal FLAIR signal in the left hippocampus, which may represent additional infarct versus postictal changes. Consider EEG correlation if clinically indicated. 3. No evidence of acute LVO. 4. Multifocal moderate to severe stenosis of the right P2 segment. Severe stenosis of the mid/distal P2 segment of the left PCA. Evaluation of the P2 and P3 segments also somewhat limited by artifact and small vessel caliber. 5. 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 No evidence of right ACA territory infarct. 6. 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 Electronically signed by: Donnice Mania MD 12/26/2024 12:29 PM EST RP Workstation: [id number]   CT Head Wo Contrast Result Date: 12/26/2024 CLINICAL DATA:  Minor head trauma, weakness, injury EXAM: CT HEAD WITHOUT CONTRAST TECHNIQUE: Contiguous axial images were obtained from the base of the skull through the vertex without intravenous contrast. RADIATION DOSE REDUCTION: This exam was performed according to the departmental dose-optimization program which includes automated exposure control, adjustment of the mA and/or kV according to patient size and/or use of iterative reconstruction technique. COMPARISON:  11/03/2004 FINDINGS: Brain: Diffuse mild brain atrophy pattern and minor white matter microvascular ischemic changes throughout the periventricular regions of both cerebral hemispheres. No acute intracranial hemorrhage, definite acute infarction, mass lesion, midline shift, herniation, hydrocephalus, or extra-axial fluid collection. No  focal mass effect or edema. Cisterns are patent. Cerebellar atrophy as well. Vascular: No hyperdense vessel or unexpected calcification. Skull: Normal. Negative for fracture or focal lesion. Sinuses/Orbits: No acute finding. Other: None. IMPRESSION: 1. Brain atrophy and white matter microvascular ischemic changes. 2. No acute intracranial abnormality by noncontrast CT. Electronically Signed   By: CHRISTELLA.  Shick M.D.   On: 12/26/2024 10:16   DG Chest Portable 1 View Result Date: 12/26/2024 CLINICAL DATA:  Weakness EXAM: PORTABLE CHEST 1 VIEW COMPARISON:  Apr 02, 2011 FINDINGS: The heart size and mediastinal contours are within normal limits. Both lungs are clear. The visualized skeletal structures are unremarkable. IMPRESSION: No active disease. Electronically Signed   By: Lynwood Landy Raddle M.D.   On: 12/26/2024 09:22        Scheduled Meds:  artificial tears  1 drop Both Eyes BID   aspirin  EC  81 mg Oral Daily   atorvastatin   40 mg Oral Daily   clopidogrel   75 mg Oral Daily  heparin   5,000 Units Subcutaneous Q8H   metoprolol  succinate  50 mg Oral Daily   pantoprazole   40 mg Oral Daily   Continuous Infusions:     LOS: 1 day    Time spent: 35 Minutes    Luceal Hollibaugh A Chitara Clonch, MD [facility]   If 7PM-7AM, please contact night-coverage [email or web address]  12/28/2024, 7:56 AM   "

## 2024-12-29 ENCOUNTER — Other Ambulatory Visit: Payer: Self-pay

## 2024-12-29 ENCOUNTER — Encounter (HOSPITAL_COMMUNITY): Payer: Self-pay | Admitting: Internal Medicine

## 2024-12-29 ENCOUNTER — Inpatient Hospital Stay (HOSPITAL_COMMUNITY): Admission: AD | Admit: 2024-12-29 | Source: Intra-hospital | Admitting: Physical Medicine & Rehabilitation

## 2024-12-29 ENCOUNTER — Encounter (HOSPITAL_COMMUNITY): Payer: Self-pay | Admitting: Physical Medicine & Rehabilitation

## 2024-12-29 DIAGNOSIS — I63532 Cerebral infarction due to unspecified occlusion or stenosis of left posterior cerebral artery: Principal | ICD-10-CM | POA: Diagnosis present

## 2024-12-29 MED ORDER — BISACODYL 10 MG RE SUPP: 10.0000 mg | Freq: Every day | RECTAL | Status: AC | PRN

## 2024-12-29 MED ORDER — ATORVASTATIN CALCIUM 40 MG PO TABS: 40.0000 mg | ORAL_TABLET | Freq: Every day | ORAL | Status: AC

## 2024-12-29 MED ORDER — ENOXAPARIN SODIUM 30 MG/0.3ML IJ SOSY: 30.0000 mg | PREFILLED_SYRINGE | INTRAMUSCULAR | Status: DC

## 2024-12-29 MED ORDER — SENNOSIDES-DOCUSATE SODIUM 8.6-50 MG PO TABS: 1.0000 | ORAL_TABLET | Freq: Every evening | ORAL | Status: AC | PRN

## 2024-12-29 MED ORDER — POLYVINYL ALCOHOL 1.4 % OP SOLN
1.0000 [drp] | Freq: Two times a day (BID) | OPHTHALMIC | Status: AC
  Administered 2024-12-29: 1 [drp] via OPHTHALMIC

## 2024-12-29 MED ORDER — ATORVASTATIN CALCIUM 40 MG PO TABS: 40.0000 mg | ORAL_TABLET | Freq: Every day | ORAL | 0 refills | Status: AC

## 2024-12-29 MED ORDER — CLOPIDOGREL BISULFATE 75 MG PO TABS: 75.0000 mg | ORAL_TABLET | Freq: Every day | ORAL | 0 refills | Status: AC

## 2024-12-29 MED ORDER — PROCHLORPERAZINE 25 MG RE SUPP: 12.5000 mg | Freq: Four times a day (QID) | RECTAL | Status: AC | PRN

## 2024-12-29 MED ORDER — PANTOPRAZOLE SODIUM 40 MG PO TBEC: 40.0000 mg | DELAYED_RELEASE_TABLET | Freq: Every day | ORAL | Status: AC

## 2024-12-29 MED ORDER — LORAZEPAM 0.5 MG PO TABS: 0.5000 mg | ORAL_TABLET | Freq: Every evening | ORAL | Status: DC | PRN

## 2024-12-29 MED ORDER — ALUM & MAG HYDROXIDE-SIMETH 200-200-20 MG/5ML PO SUSP: 30.0000 mL | ORAL | Status: AC | PRN

## 2024-12-29 MED ORDER — METOPROLOL SUCCINATE ER 25 MG PO TB24: 25.0000 mg | ORAL_TABLET | Freq: Every day | ORAL | Status: AC

## 2024-12-29 MED ORDER — ASPIRIN 81 MG PO TBEC: 81.0000 mg | DELAYED_RELEASE_TABLET | Freq: Every day | ORAL | Status: AC

## 2024-12-29 MED ORDER — LORAZEPAM 0.5 MG PO TABS
1.0000 mg | ORAL_TABLET | Freq: Every day | ORAL | Status: AC
  Administered 2024-12-29: 1 mg via ORAL
  Filled 2024-12-29: qty 2

## 2024-12-29 MED ORDER — DIPHENHYDRAMINE HCL 25 MG PO CAPS: 25.0000 mg | ORAL_CAPSULE | Freq: Four times a day (QID) | ORAL | Status: AC | PRN

## 2024-12-29 MED ORDER — METOPROLOL SUCCINATE ER 25 MG PO TB24: 25.0000 mg | ORAL_TABLET | Freq: Every day | ORAL | 0 refills | Status: AC

## 2024-12-29 MED ORDER — PROCHLORPERAZINE MALEATE 5 MG PO TABS: 5.0000 mg | ORAL_TABLET | Freq: Four times a day (QID) | ORAL | Status: AC | PRN

## 2024-12-29 MED ORDER — GUAIFENESIN-DM 100-10 MG/5ML PO SYRP: 5.0000 mL | ORAL_SOLUTION | Freq: Four times a day (QID) | ORAL | Status: AC | PRN

## 2024-12-29 MED ORDER — PROCHLORPERAZINE EDISYLATE 10 MG/2ML IJ SOLN: 5.0000 mg | Freq: Four times a day (QID) | INTRAMUSCULAR | Status: AC | PRN

## 2024-12-29 MED ORDER — FLEET ENEMA RE ENEM: 1.0000 | ENEMA | Freq: Once | RECTAL | Status: AC | PRN

## 2024-12-29 MED ORDER — CLOPIDOGREL BISULFATE 75 MG PO TABS: 75.0000 mg | ORAL_TABLET | Freq: Every day | ORAL | Status: AC

## 2024-12-29 MED ORDER — ACETAMINOPHEN 325 MG PO TABS: 325.0000 mg | ORAL_TABLET | ORAL | Status: AC | PRN

## 2024-12-29 NOTE — Plan of Care (Signed)
"   Called report to CIR, Patient discharged  Problem: Education: Goal: Knowledge of disease or condition will improve Outcome: Adequate for Discharge Goal: Knowledge of secondary prevention will improve (MUST DOCUMENT ALL) Outcome: Adequate for Discharge Goal: Knowledge of patient specific risk factors will improve (DELETE if not current risk factor) Outcome: Adequate for Discharge   Problem: Ischemic Stroke/TIA Tissue Perfusion: Goal: Complications of ischemic stroke/TIA will be minimized Outcome: Adequate for Discharge   Problem: Coping: Goal: Will verbalize positive feelings about self Outcome: Adequate for Discharge Goal: Will identify appropriate support needs Outcome: Adequate for Discharge   Problem: Health Behavior/Discharge Planning: Goal: Ability to manage health-related needs will improve Outcome: Adequate for Discharge Goal: Goals will be collaboratively established with patient/family Outcome: Adequate for Discharge   Problem: Self-Care: Goal: Ability to participate in self-care as condition permits will improve Outcome: Adequate for Discharge Goal: Verbalization of feelings and concerns over difficulty with self-care will improve Outcome: Adequate for Discharge Goal: Ability to communicate needs accurately will improve Outcome: Adequate for Discharge   Problem: Nutrition: Goal: Risk of aspiration will decrease Outcome: Adequate for Discharge Goal: Dietary intake will improve Outcome: Adequate for Discharge   Problem: Education: Goal: Knowledge of General Education information will improve Description: Including pain rating scale, medication(s)/side effects and non-pharmacologic comfort measures Outcome: Adequate for Discharge   Problem: Health Behavior/Discharge Planning: Goal: Ability to manage health-related needs will improve Outcome: Adequate for Discharge   Problem: Clinical Measurements: Goal: Ability to maintain clinical measurements within normal  limits will improve Outcome: Adequate for Discharge Goal: Will remain free from infection Outcome: Adequate for Discharge Goal: Diagnostic test results will improve Outcome: Adequate for Discharge Goal: Respiratory complications will improve Outcome: Adequate for Discharge Goal: Cardiovascular complication will be avoided Outcome: Adequate for Discharge   Problem: Activity: Goal: Risk for activity intolerance will decrease Outcome: Adequate for Discharge   Problem: Nutrition: Goal: Adequate nutrition will be maintained Outcome: Adequate for Discharge   Problem: Coping: Goal: Level of anxiety will decrease Outcome: Adequate for Discharge   Problem: Elimination: Goal: Will not experience complications related to bowel motility Outcome: Adequate for Discharge Goal: Will not experience complications related to urinary retention Outcome: Adequate for Discharge   Problem: Pain Managment: Goal: General experience of comfort will improve and/or be controlled Outcome: Adequate for Discharge   Problem: Safety: Goal: Ability to remain free from injury will improve Outcome: Adequate for Discharge   Problem: Skin Integrity: Goal: Risk for impaired skin integrity will decrease Outcome: Adequate for Discharge   "

## 2024-12-29 NOTE — Progress Notes (Signed)
 Inpatient Rehabilitation Admission Medication Review by a Pharmacist  A complete drug regimen review was completed for this patient to identify any potential clinically significant medication issues.  High Risk Drug Classes Is patient taking? Indication by Medication  Antipsychotic Yes Compazine  - nausea and vomiting  Anticoagulant Yes Lovenox  - DVT prophylaxis  Antibiotic No   Opioid No   Antiplatelet Yes ASA/Plavix  - stroke  Hypoglycemics/insulin No   Vasoactive Medication Yes Metoprolol  - HTN  Chemotherapy No   Other Yes Senna S - constipation Atorvastatin  - HLD Ativan  - Anxiety Pantoprazole  - GERD     Type of Medication Issue Identified Description of Issue Recommendation(s)  Drug Interaction(s) (clinically significant)     Duplicate Therapy     Allergy     No Medication Administration End Date     Incorrect Dose     Additional Drug Therapy Needed     Significant med changes from prior encounter (inform family/care partners about these prior to discharge). PTA Meds not continued inpatient: Chlorthalidone Spironolactone  Verapamil Tramadol Assess and determine if these meds need to be restarted during CIR admission or Prior to discharge.  Other       Clinically significant medication issues were identified that warrant physician communication and completion of prescribed/recommended actions by midnight of the next day:  No  Name of provider notified for urgent issues identified:   Provider Method of Notification:     Pharmacist comments:   Time spent performing this drug regimen review (minutes):  20   Larraine Brazier, PharmD Clinical Pharmacist 12/29/2024  6:59 PM **Pharmacist phone directory can now be found on [email or web address] ([email or web address]).  Listed under [facility].

## 2024-12-29 NOTE — Progress Notes (Signed)
 Inpatient Rehab Admissions Coordinator:   Awaiting insurance determination from [facility] regarding CIR prior auth request.  Will follow.   Reche Lowers, PT, DPT Admissions Coordinator [phone] 12/29/24 9:43 AM

## 2024-12-29 NOTE — TOC Transition Note (Signed)
 Transition of Care [facility]) - Discharge Note   Patient Details  Name: [name] MRN: [id number] Date of Birth: 1931-03-06  [facility] CM/SW Contact:  Andrez JULIANNA George, RN Phone Number: 12/29/2024, 1:42 PM   Clinical Narrative:     Pt is discharging to CIR today. ICM signing off.   Final next level of care: IP Rehab Facility Barriers to Discharge: No Barriers Identified   Patient Goals and CMS Choice   CMS Medicare.gov Compare Post Acute Care list provided to:: Patient Choice offered to / list presented to : Patient      Discharge Placement                       Discharge Plan and Services Additional resources added to the After Visit Summary for     Discharge Planning Services: CM Consult Post Acute Care Choice: IP Rehab                               Social Drivers of Health (SDOH) Interventions SDOH Screenings   Food Insecurity: No Food Insecurity (12/26/2024)  Housing: High Risk (12/26/2024)  Transportation Needs: No Transportation Needs (12/26/2024)  Utilities: Not At Risk (12/26/2024)  Social Connections: Socially Isolated (12/26/2024)  Tobacco Use: Low Risk (12/26/2024)     Readmission Risk Interventions     No data to display

## 2024-12-29 NOTE — Progress Notes (Addendum)
 Inpatient Rehab Admissions Coordinator:    I have insurance approval and a bed available for pt to admit to CIR today. Dr. Madelyne in agreement and [facility] aware.  I will notify pt/family and make arrangements.    1354: Updated son and reviewed admissions information on the phone.  Reche Lowers, PT, DPT Admissions Coordinator [phone] 12/29/24 1:51 PM

## 2024-12-29 NOTE — Plan of Care (Signed)

## 2024-12-29 NOTE — Discharge Summary (Signed)
 " Physician Discharge Summary   Patient: [name] MRN: [id number] DOB: Jun 25, 1931  Admit date:     12/26/2024  Discharge date: 12/29/24  Discharge Physician: [name]   PCP: Nicole Carwin, MD   Recommendations at discharge:   Needs aspirin  and plavix  for 3 months then plavix  alone.   Discharge Diagnoses: Principal Problem:   Acute ischemic stroke [facility])  Resolved Problems:   * No resolved hospital problems. *  Hospital Course: 89 year old with past medical history significant for anxiety hypertension GERD, hyperlipidemia history of PVC presented to the hospital secondary to right -sided weakness and some difficulty finding words.  Patient reports decreased oral intake and intermittent episode of diarrhea for the last 2 days.  Today of admission patient felt weak and fell, reports her leg just gave out.   Evaluation in the ED CT scan showed no acute intracranial abnormality.  Lab work consistent with AKI and hyperkalemia.  Chest x-ray no acute cardiopulmonary process, MRI showed acute PCA ischemic stroke.  Patient was transferred to [facility] for further evaluation.        Assessment and Plan: 1-Acute ischemic stroke -Presented with right side weakness and difficulty finding words - CT head: Brain atrophy and white matter microvascular ischemic changes - MRI brain: Acute infarct in the left thalamus and left temporal periventricular white matter.  Possible additional infarct in the left Hypaque-M post.  No evidence of acute large vessel occlusion. - MRI angio head: No large vessel occlusion, multiple moderate to severe stenosis right P2 segment, A3 segment.. 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 LDL 116, A1c 5.3 Currently on aspirin  and Plavix  ECHO Normal.  Follow up neurology recommendations. Needs aspirin  and plavix  for 3 month, then Plavix  alone.   stable for transfer to CIR  Hypertension Orthostatic  Hypotension.  - Discussed with neurology no need for permissive hypertension. -BP management limited by Orthostatic Hypotension and dizziness.  Hold spironolactone  and diuretics.  Current hydralazine  as needed ordered Started metoprolol  25 mg daily. She was probably taking 25 mg at home, per son.  If orthostatic hypotension allows could consider lower dose diltiazem if need for BP controlled.   AKI: - Received IV fluid Presenting with a creatinine 1.5, down to 1.1 improved Continue to hold diuretics Improved.    Hyperkalemia: Received Lokelma , resolved.    GERD: - Continue Protonix    History of Anxiety:  As needed Xanax   Hyperglycemia: No consistent with diabetes, hemoglobin A1c 5.3 A1c: 5.3   Dehydration, Hyperkalemia, Hyponatremia:  Received  IV fluid Holding diuretics.               Consultants: Neurology  Procedures performed: ECHO Disposition: Rehabilitation facility Diet recommendation:  Cardiac diet DISCHARGE MEDICATION: Allergies as of 12/29/2024       Reactions   Tricyclic Antidepressants Other (See Comments)   felt like I was going to have a seizure        Medication List     STOP taking these medications    chlorthalidone 25 MG tablet Commonly known as: HYGROTON   spironolactone  25 MG tablet Commonly known as: ALDACTONE    verapamil 240 MG CR tablet Commonly known as: CALAN-SR       TAKE these medications    acetaminophen  500 MG tablet Commonly known as: TYLENOL  Take 1,000 mg by mouth as needed for mild pain or fever.   aspirin  81 MG tablet Take 81 mg by mouth daily.  atorvastatin  40 MG tablet Commonly known as: LIPITOR Take 1 tablet (40 mg total) by mouth daily. Start taking on: December 30, 2024   clopidogrel  75 MG tablet Commonly known as: PLAVIX  Take 1 tablet (75 mg total) by mouth daily. Start taking on: December 30, 2024   docusate sodium  100 MG capsule Commonly known as: COLACE Take 100 mg by mouth daily as  needed for mild constipation.   LORazepam  1 MG tablet Commonly known as: ATIVAN  Take 1 mg by mouth at bedtime.   metoprolol  succinate 25 MG 24 hr tablet Commonly known as: TOPROL -XL Take 1 tablet (25 mg total) by mouth daily. Start taking on: December 30, 2024 What changed:  medication strength how much to take additional instructions   pantoprazole  40 MG tablet Commonly known as: PROTONIX  Take 40 mg by mouth at bedtime as needed.   SYSTANE ULTRA OP Place 1 drop into both eyes in the morning and at bedtime.   traMADol 50 MG tablet Commonly known as: ULTRAM Take 50 mg by mouth daily as needed for severe pain.        Follow-up Information     [facility]. Schedule an appointment as soon as possible for a visit in 1 month(s).   Specialty: Neurology Why: stroke clinic Contact information: [address] [place]  [id number] [phone]               Discharge Exam: [name]   12/26/24 0819  Weight: 63 kg   General. NAD  Condition at discharge: stable  The results of significant diagnostics from this hospitalization (including imaging, microbiology, ancillary and laboratory) are listed below for reference.   Imaging Studies: VAS US  CAROTID Result Date: 12/27/2024 Carotid Arterial Duplex Study Patient Name:  [name]  Date of Exam:   12/27/2024 Medical Rec #: [id number]         Accession #:    [id number] Date of Birth: [birth year]          Patient Gender: F Patient Age:   89 years Exam Location:  [facility] Procedure:      VAS US  CAROTID Referring Phys: ARY XU --------------------------------------------------------------------------------  Indications:       CVA. Risk Factors:      Hypertension, hyperlipidemia, no history of smoking. Comparison Study:  No previous exams Performing Technologist: Jody Hill RVT, RDMS  Examination Guidelines: A complete evaluation includes B-mode imaging,  spectral Doppler, color Doppler, and power Doppler as needed of all accessible portions of each vessel. Bilateral testing is considered an integral part of a complete examination. Limited examinations for reoccurring indications may be performed as noted.  Right Carotid Findings: +----------+--------+--------+--------+------------------+--------+           PSV cm/sEDV cm/sStenosisPlaque DescriptionComments +----------+--------+--------+--------+------------------+--------+ CCA Prox  78      16                                         +----------+--------+--------+--------+------------------+--------+ CCA Distal60      13                                         +----------+--------+--------+--------+------------------+--------+ ICA Prox  51      12  calcific                   +----------+--------+--------+--------+------------------+--------+ ICA Distal69      21                                         +----------+--------+--------+--------+------------------+--------+ ECA       77      9                                          +----------+--------+--------+--------+------------------+--------+ +----------+--------+-------+----------------+-------------------+           PSV cm/sEDV cmsDescribe        Arm Pressure (mmHG) +----------+--------+-------+----------------+-------------------+ [id number]             Multiphasic, WNL                    +----------+--------+-------+----------------+-------------------+ +---------+--------+--+--------+--+---------+ VertebralPSV cm/s62EDV cm/s17Antegrade +---------+--------+--+--------+--+---------+  Left Carotid Findings: +----------+--------+--------+--------+------------------+------------------+           PSV cm/sEDV cm/sStenosisPlaque DescriptionComments           +----------+--------+--------+--------+------------------+------------------+ CCA Prox  76      15                                                    +----------+--------+--------+--------+------------------+------------------+ CCA Distal58      12                                intimal thickening +----------+--------+--------+--------+------------------+------------------+ ICA Prox  57      18                                                   +----------+--------+--------+--------+------------------+------------------+ ICA Distal60      18                                                   +----------+--------+--------+--------+------------------+------------------+ ECA       85      8                                                    +----------+--------+--------+--------+------------------+------------------+ +----------+--------+--------+----------------+-------------------+           PSV cm/sEDV cm/sDescribe        Arm Pressure (mmHG) +----------+--------+--------+----------------+-------------------+ [id number]             Multiphasic, WNL                    +----------+--------+--------+----------------+-------------------+ +---------+--------+--+--------+--+---------+ VertebralPSV cm/s64EDV cm/s15Antegrade +---------+--------+--+--------+--+---------+   Summary: Right Carotid: The extracranial vessels were near-normal with only minimal wall  thickening or plaque. Left Carotid: The extracranial vessels were near-normal with only minimal wall               thickening or plaque. Vertebrals:  Bilateral vertebral arteries demonstrate antegrade flow. Subclavians: Normal flow hemodynamics were seen in bilateral subclavian              arteries. *See table(s) above for measurements and observations.  Electronically signed by Lonni Gaskins MD on 12/27/2024 at 6:38:32 PM.    Final    ECHOCARDIOGRAM COMPLETE Result Date: 12/27/2024    ECHOCARDIOGRAM REPORT   Patient Name:   SHANNAH CONTEH Date of Exam: 12/27/2024 Medical Rec #:  [id number]        Height:       60.0 in  Accession #:    [id number]       Weight:       138.9 lb Date of Birth:  01-Mar-1931         BSA:          1.599 m Patient Age:    89 years         BP:           166/82 mmHg Patient Gender: F                HR:           78 bpm. Exam Location:  Inpatient Procedure: 2D Echo, Cardiac Doppler and Color Doppler (Both Spectral and Color            Flow Doppler were utilized during procedure). Indications:    Stroke  History:        Patient has no prior history of Echocardiogram examinations,                 most recent 03/22/2020. Stroke, Arrythmias:PVC; Risk                 Factors:Hypertension and Dyslipidemia.  Sonographer:    Juliene Rucks Referring Phys: [phone] CARLOS MADERA IMPRESSIONS  1. Left ventricular ejection fraction, by estimation, is 55 to 60%. The left ventricle has normal function. The left ventricle has no regional wall motion abnormalities. Left ventricular diastolic parameters were normal.  2. Right ventricular systolic function is normal. The right ventricular size is normal. Tricuspid regurgitation signal is inadequate for assessing PA pressure.  3. The mitral valve is normal in structure. Mild to moderate mitral valve regurgitation. No evidence of mitral stenosis.  4. The aortic valve is normal in structure. Aortic valve regurgitation is not visualized. No aortic stenosis is present.  5. The inferior vena cava is normal in size with greater than 50% respiratory variability, suggesting right atrial pressure of 3 mmHg. FINDINGS  Left Ventricle: Left ventricular ejection fraction, by estimation, is 55 to 60%. The left ventricle has normal function. The left ventricle has no regional wall motion abnormalities. The left ventricular internal cavity size was normal in size. There is  no left ventricular hypertrophy. Left ventricular diastolic parameters were normal. Right Ventricle: The right ventricular size is normal. No increase in right ventricular wall thickness. Right ventricular systolic function is  normal. Tricuspid regurgitation signal is inadequate for assessing PA pressure. Left Atrium: Left atrial size was normal in size. Right Atrium: Right atrial size was normal in size. Pericardium: There is no evidence of pericardial effusion. Presence of epicardial fat layer. Mitral Valve: The mitral valve is normal in structure. Mild to moderate mitral valve regurgitation. No evidence of mitral valve  stenosis. Tricuspid Valve: The tricuspid valve is normal in structure. Tricuspid valve regurgitation is not demonstrated. No evidence of tricuspid stenosis. Aortic Valve: The aortic valve is normal in structure. Aortic valve regurgitation is not visualized. No aortic stenosis is present. Aortic valve mean gradient measures 3.0 mmHg. Aortic valve peak gradient measures 6.1 mmHg. Aortic valve area, by VTI measures 1.95 cm. Pulmonic Valve: The pulmonic valve was normal in structure. Pulmonic valve regurgitation is not visualized. No evidence of pulmonic stenosis. Aorta: The aortic root and ascending aorta are structurally normal, with no evidence of dilitation. Venous: The inferior vena cava is normal in size with greater than 50% respiratory variability, suggesting right atrial pressure of 3 mmHg. IAS/Shunts: No atrial level shunt detected by color flow Doppler.  LEFT VENTRICLE PLAX 2D LVIDd:         4.50 cm     Diastology LVIDs:         3.40 cm     LV e' medial:    7.33 cm/s LV PW:         0.90 cm     LV E/e' medial:  10.2 LV IVS:        1.00 cm     LV e' lateral:   12.10 cm/s LVOT diam:     1.90 cm     LV E/e' lateral: 6.2 LV SV:         55 LV SV Index:   35 LVOT Area:     2.84 cm  LV Volumes (MOD) LV vol d, MOD A2C: 68.4 ml LV vol d, MOD A4C: 68.7 ml LV vol s, MOD A2C: 32.7 ml LV vol s, MOD A4C: 26.1 ml LV SV MOD A2C:     35.7 ml LV SV MOD A4C:     68.7 ml LV SV MOD BP:      39.8 ml RIGHT VENTRICLE RV Basal diam:  3.10 cm RV Mid diam:    2.50 cm RV S prime:     15.70 cm/s TAPSE (M-mode): 2.1 cm LEFT ATRIUM              Index        RIGHT ATRIUM           Index LA diam:        2.50 cm 1.56 cm/m   RA Area:     12.70 cm LA Vol (A2C):   51.7 ml 32.34 ml/m  RA Volume:   30.50 ml  19.08 ml/m LA Vol (A4C):   41.8 ml 26.15 ml/m LA Biplane Vol: 46.8 ml 29.27 ml/m  AORTIC VALVE AV Area (Vmax):    2.02 cm AV Area (Vmean):   2.06 cm AV Area (VTI):     1.95 cm AV Vmax:           123.00 cm/s AV Vmean:          83.400 cm/s AV VTI:            0.284 m AV Peak Grad:      6.1 mmHg AV Mean Grad:      3.0 mmHg LVOT Vmax:         87.50 cm/s LVOT Vmean:        60.600 cm/s LVOT VTI:          0.195 m LVOT/AV VTI ratio: 0.69  AORTA Ao Root diam: 2.60 cm Ao Asc diam:  3.00 cm MITRAL VALVE MV Area (PHT): 4.60 cm     SHUNTS MV Decel  Time: 165 msec     Systemic VTI:  0.20 m MV E velocity: 74.70 cm/s   Systemic Diam: 1.90 cm MV A velocity: 135.00 cm/s MV E/A ratio:  0.55 Kardie Tobb DO Electronically signed by Dub Huntsman DO Signature Date/Time: 12/27/2024/4:42:09 PM    Final    EEG adult Result Date: 12/27/2024 Shelton Arlin KIDD, MD     12/27/2024 10:17 AM Patient Name: ANNIKAH LOVINS MRN: [id number] Epilepsy Attending: Arlin KIDD Shelton Referring Physician/Provider: Jerri Pfeiffer, MD Date: 12/27/2024 Duration: 23.26 mins Patient history: 89 y.o. female with chief complaint of right-sided weakness numbness. EEG to evaluate for seizure Level of alertness: Awake AEDs during EEG study: None Technical aspects: This EEG study was done with scalp electrodes positioned according to the 10-20 International system of electrode placement. Electrical activity was reviewed with band pass filter of 1-70Hz , sensitivity of 7 uV/mm, display speed of 72mm/sec with a 60Hz  notched filter applied as appropriate. EEG data were recorded continuously and digitally stored.  Video monitoring was available and reviewed as appropriate. Description: The posterior dominant rhythm consists of 8 Hz activity of moderate voltage (25-35 uV) seen predominantly in posterior head regions,  symmetric and reactive to eye opening and eye closing. Hyperventilation and photic stimulation were not performed.   IMPRESSION: This study is within normal limits. No seizures or epileptiform discharges were seen throughout the recording. A normal interictal EEG does not exclude the diagnosis of epilepsy. Arlin KIDD Shelton   MR ANGIO HEAD WO CONTRAST Result Date: 12/26/2024 EXAM: MRI BRAIN WITHOUT CONTRAST MRA HEAD WITHOUT CONTRAST 12/26/2024 11:44:27 AM TECHNIQUE: Multiplanar multisequence MRI of the brain was performed without the administration of intravenous contrast. MRA of the head was performed without contrast using time-of-flight technique. 3D postprocessing with multiplanar reformations and MIPs was performed for better evaluation of the vasculature. COMPARISON: Same day CT head. CLINICAL HISTORY: Neuro deficit, acute, stroke suspected. Acute neurological deficit; stroke suspected. FINDINGS: LIMITATIONS/ARTIFACTS: The MRI is somewhat limited by motion artifact. MRI BRAIN: BRAIN AND VENTRICLES: There is an 8 mm focus of restricted diffusion in the left thalamus compatible with acute infarct. Additional areas of restricted diffusion along the left hippocampus along with abnormal FLAIR signal intensity in this region; finding could reflect additional acute infarct versus postictal changes. Recommend correlation with clinical history and consider correlation with EEG findings if clinically indicated. T2 and FLAIR hyperintensity in the periventricular and subcortical white matter suggestive of mild chronic microvascular ischemic changes. There is mild age-related parenchymal volume loss. Chronic microhemorrhage in the left occipital lobe. No acute intracranial hemorrhage. No mass. No midline shift. No hydrocephalus. The sella is unremarkable. Normal flow voids. ORBITS: There is a 0.8 x 0.5 cm mass along the superolateral preseptal soft tissues of the left orbit which was present in 2005 and appears minimally  increased in size; favor benign etiology. Bilateral lens replacement. Slight irregular contour at the posterior aspect of both globes suggestive of axial myopia. SINUSES AND MASTOIDS: Mucous retention cyst in the right maxillary sinus. No significant abnormality in the mastoids. BONES AND SOFT TISSUES: Degenerative changes in the visualized upper cervical spine. There is at least mild spinal canal stenosis at the C4-C5 and C5-C6 levels. Normal bone marrow signal. No soft tissue abnormality. MRA HEAD: ANTERIOR CIRCULATION: The intracranial internal carotid arteries are patent bilaterally. There is mild irregularity of the carotid siphons likely related to artifact as well as atherosclerosis. There is a 3 mm inferiorly directed outpouching along the left supraclinoid ICA concerning for small  aneurysm at the origin of the posterior communicating artery. There is likely an additional 2.5 mm inferiorly directed outpouching at the left ICA terminus concerning for additional aneurysm. There is short segment occlusion of the A3 segment of the right ACA. There is reconstitution of the proximal right A4 segment. No significant stenosis of the anterior cerebral arteries (other than the right A3 occlusion). No significant stenosis of the middle cerebral arteries. POSTERIOR CIRCULATION: There is irregularity of the P1 and P2 segments of the right PCA. Multifocal moderate to severe stenosis of the right P2 segment particularly along the midportion. There is additional irregularity of the left P2 segment. There is severe stenosis of the mid and distal P2 segment of the left PCA. No significant stenosis of the basilar artery. No significant stenosis of the vertebral arteries. The superior cerebellar arteries are not well visualized due to artifact. AICA visualized bilaterally, greater on the left. Origins of the PICAs visualized bilaterally. No aneurysm (in the posterior circulation). IMPRESSION: 1. Acute infarct in the left  thalamus and left temporal periventricular white matter. 2. Additional restricted diffusion and abnormal FLAIR signal in the left hippocampus, which may represent additional infarct versus postictal changes. Consider EEG correlation if clinically indicated. 3. No evidence of acute LVO. 4. Multifocal moderate to severe stenosis of the right P2 segment. Severe stenosis of the mid/distal P2 segment of the left PCA. Evaluation of the P2 and P3 segments also somewhat limited by artifact and small vessel caliber. 5. 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 No evidence of right ACA territory infarct. 6. 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 Electronically signed by: Donnice Mania MD 12/26/2024 12:29 PM EST RP Workstation: [id number]   MR BRAIN WO CONTRAST Result Date: 12/26/2024 EXAM: MRI BRAIN WITHOUT CONTRAST MRA HEAD WITHOUT CONTRAST 12/26/2024 11:44:27 AM TECHNIQUE: Multiplanar multisequence MRI of the brain was performed without the administration of intravenous contrast. MRA of the head was performed without contrast using time-of-flight technique. 3D postprocessing with multiplanar reformations and MIPs was performed for better evaluation of the vasculature. COMPARISON: Same day CT head. CLINICAL HISTORY: Neuro deficit, acute, stroke suspected. Acute neurological deficit; stroke suspected. FINDINGS: LIMITATIONS/ARTIFACTS: The MRI is somewhat limited by motion artifact. MRI BRAIN: BRAIN AND VENTRICLES: There is an 8 mm focus of restricted diffusion in the left thalamus compatible with acute infarct. Additional areas of restricted diffusion along the left hippocampus along with abnormal FLAIR signal intensity in this region; finding could reflect additional acute infarct versus postictal changes. Recommend correlation with clinical history and consider correlation with EEG  findings if clinically indicated. T2 and FLAIR hyperintensity in the periventricular and subcortical white matter suggestive of mild chronic microvascular ischemic changes. There is mild age-related parenchymal volume loss. Chronic microhemorrhage in the left occipital lobe. No acute intracranial hemorrhage. No mass. No midline shift. No hydrocephalus. The sella is unremarkable. Normal flow voids. ORBITS: There is a 0.8 x 0.5 cm mass along the superolateral preseptal soft tissues of the left orbit which was present in 2005 and appears minimally increased in size; favor benign etiology. Bilateral lens replacement. Slight irregular contour at the posterior aspect of both globes suggestive of axial myopia. SINUSES AND MASTOIDS: Mucous retention cyst in the right maxillary sinus. No significant abnormality in the mastoids. BONES AND SOFT TISSUES: Degenerative changes in the visualized upper cervical spine. There is at least mild  spinal canal stenosis at the C4-C5 and C5-C6 levels. Normal bone marrow signal. No soft tissue abnormality. MRA HEAD: ANTERIOR CIRCULATION: The intracranial internal carotid arteries are patent bilaterally. There is mild irregularity of the carotid siphons likely related to artifact as well as atherosclerosis. There is a 3 mm inferiorly directed outpouching along the left supraclinoid ICA concerning for small aneurysm at the origin of the posterior communicating artery. There is likely an additional 2.5 mm inferiorly directed outpouching at the left ICA terminus concerning for additional aneurysm. There is short segment occlusion of the A3 segment of the right ACA. There is reconstitution of the proximal right A4 segment. No significant stenosis of the anterior cerebral arteries (other than the right A3 occlusion). No significant stenosis of the middle cerebral arteries. POSTERIOR CIRCULATION: There is irregularity of the P1 and P2 segments of the right PCA. Multifocal moderate to severe  stenosis of the right P2 segment particularly along the midportion. There is additional irregularity of the left P2 segment. There is severe stenosis of the mid and distal P2 segment of the left PCA. No significant stenosis of the basilar artery. No significant stenosis of the vertebral arteries. The superior cerebellar arteries are not well visualized due to artifact. AICA visualized bilaterally, greater on the left. Origins of the PICAs visualized bilaterally. No aneurysm (in the posterior circulation). IMPRESSION: 1. Acute infarct in the left thalamus and left temporal periventricular white matter. 2. Additional restricted diffusion and abnormal FLAIR signal in the left hippocampus, which may represent additional infarct versus postictal changes. Consider EEG correlation if clinically indicated. 3. No evidence of acute LVO. 4. Multifocal moderate to severe stenosis of the right P2 segment. Severe stenosis of the mid/distal P2 segment of the left PCA. Evaluation of the P2 and P3 segments also somewhat limited by artifact and small vessel caliber. 5. 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 No evidence of right ACA territory infarct. 6. 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 Electronically signed by: Donnice Mania MD 12/26/2024 12:29 PM EST RP Workstation: [id number]   CT Head Wo Contrast Result Date: 12/26/2024 CLINICAL DATA:  Minor head trauma, weakness, injury EXAM: CT HEAD WITHOUT CONTRAST TECHNIQUE: Contiguous axial images were obtained from the base of the skull through the vertex without intravenous contrast. RADIATION DOSE REDUCTION: This exam was performed according to the departmental dose-optimization program which includes automated exposure control, adjustment of the mA and/or kV according to patient size and/or use of iterative reconstruction technique.  COMPARISON:  11/03/2004 FINDINGS: Brain: Diffuse mild brain atrophy pattern and minor white matter microvascular ischemic changes throughout the periventricular regions of both cerebral hemispheres. No acute intracranial hemorrhage, definite acute infarction, mass lesion, midline shift, herniation, hydrocephalus, or extra-axial fluid collection. No focal mass effect or edema. Cisterns are patent. Cerebellar atrophy as well. Vascular: No hyperdense vessel or unexpected calcification. Skull: Normal. Negative for fracture or focal lesion. Sinuses/Orbits: No acute finding. Other: None. IMPRESSION: 1. Brain atrophy and white matter microvascular ischemic changes. 2. No acute intracranial abnormality by noncontrast CT. Electronically Signed   By: CHRISTELLA.  Shick M.D.   On: 12/26/2024 10:16   DG Chest Portable 1 View Result Date: 12/26/2024 CLINICAL DATA:  Weakness EXAM: PORTABLE CHEST 1 VIEW COMPARISON:  Apr 02, 2011 FINDINGS: The heart size and mediastinal contours are within normal limits. Both lungs are clear. The visualized skeletal structures  are unremarkable. IMPRESSION: No active disease. Electronically Signed   By: Lynwood Landy Raddle M.D.   On: 12/26/2024 09:22    Microbiology: Results for orders placed or performed during the hospital encounter of 12/26/24  Resp panel by RT-PCR (RSV, Flu A&B, Covid) Anterior Nasal Swab     Status: None   Collection Time: 12/26/24  8:48 AM   Specimen: Anterior Nasal Swab  Result Value Ref Range Status   SARS Coronavirus 2 by RT PCR NEGATIVE NEGATIVE Final    Comment: (NOTE) SARS-CoV-2 target nucleic acids are NOT DETECTED.  The SARS-CoV-2 RNA is generally detectable in upper respiratory specimens during the acute phase of infection. The lowest concentration of SARS-CoV-2 viral copies this assay can detect is 138 copies/mL. A negative result does not preclude SARS-Cov-2 infection and should not be used as the sole basis for treatment or other patient management  decisions. A negative result may occur with  improper specimen collection/handling, submission of specimen other than nasopharyngeal swab, presence of viral mutation(s) within the areas targeted by this assay, and inadequate number of viral copies(<138 copies/mL). A negative result must be combined with clinical observations, patient history, and epidemiological information. The expected result is Negative.  Fact Sheet for Patients:  bloggercourse.com  Fact Sheet for Healthcare Providers:  seriousbroker.it  This test is no t yet approved or cleared by the [place]  FDA and  has been authorized for detection and/or diagnosis of SARS-CoV-2 by FDA under an Emergency Use Authorization (EUA). This EUA will remain  in effect (meaning this test can be used) for the duration of the COVID-19 declaration under Section 564(b)(1) of the Act, 21 U.S.C.section 360bbb-3(b)(1), unless the authorization is terminated  or revoked sooner.       Influenza A by PCR NEGATIVE NEGATIVE Final   Influenza B by PCR NEGATIVE NEGATIVE Final    Comment: (NOTE) The Xpert Xpress SARS-CoV-2/FLU/RSV plus assay is intended as an aid in the diagnosis of influenza from Nasopharyngeal swab specimens and should not be used as a sole basis for treatment. Nasal washings and aspirates are unacceptable for Xpert Xpress SARS-CoV-2/FLU/RSV testing.  Fact Sheet for Patients: bloggercourse.com  Fact Sheet for Healthcare Providers: seriousbroker.it  This test is not yet approved or cleared by the [place]  FDA and has been authorized for detection and/or diagnosis of SARS-CoV-2 by FDA under an Emergency Use Authorization (EUA). This EUA will remain in effect (meaning this test can be used) for the duration of the COVID-19 declaration under Section 564(b)(1) of the Act, 21 U.S.C. section 360bbb-3(b)(1), unless the  authorization is terminated or revoked.     Resp Syncytial Virus by PCR NEGATIVE NEGATIVE Final    Comment: (NOTE) Fact Sheet for Patients: bloggercourse.com  Fact Sheet for Healthcare Providers: seriousbroker.it  This test is not yet approved or cleared by the [place]  FDA and has been authorized for detection and/or diagnosis of SARS-CoV-2 by FDA under an Emergency Use Authorization (EUA). This EUA will remain in effect (meaning this test can be used) for the duration of the COVID-19 declaration under Section 564(b)(1) of the Act, 21 U.S.C. section 360bbb-3(b)(1), unless the authorization is terminated or revoked.  Performed at [facility], [address], [place], [place] [address]     Labs: CBC: Recent Labs  Lab 12/26/24 0830 12/27/24 0156  WBC 6.7 5.5  NEUTROABS 4.8  --   HGB 11.5* 10.4*  HCT 34.6* 30.2*  MCV 95.8 92.9  PLT 211 203   Basic Metabolic Panel:  Recent Labs  Lab 12/26/24 0830 12/27/24 0156 12/28/24 0929  NA 130* 131* 131*  K 5.4* 5.4* 4.2  CL 98 102 99  CO2 19* 23 20*  GLUCOSE 106* 86 159*  BUN 30* 22 16  CREATININE 1.55* 1.18* 1.13*  CALCIUM  9.6 8.9 9.3  MG 1.9  --   --    Liver Function Tests: Recent Labs  Lab 12/26/24 0830  AST 17  ALT 10  ALKPHOS 74  BILITOT 0.4  PROT 6.8  ALBUMIN 4.0   CBG: No results for input(s): GLUCAP in the last 168 hours.  Discharge time spent: greater than 30 minutes.  Signed: Owen DELENA Lore, MD [facility] 12/29/2024 "

## 2024-12-29 NOTE — H&P (Signed)
 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 CT head negative. She was noted to be dehydrated with BUN 30, SCr 1.55, K-5.4 and CO2- 19. MRI/MRA brain done revealing left thalamic and left temporal white matter infarct with abnormal FLAIR left hippocampus, multifocal moderate to severe stenosis of right P2 segment and severe stenosis mi/distal P2 segment of L-PCA. EEG done negative for seizures. Dr. Jerri felt that stroke was likely due to R-PCA severe stenosis in setting of AKI, dehydration and diarrhea.  Plavix  added to ASA with recommendations to continue for 3 months followed by Plavix  alone.  PT/OT has been working with patient who requires CGA for ADL tasks and CGA to min assist with mobility. She lives alone and was independent PTA. CIR recommended due to functional decline.    Review of Systems  Constitutional:  Negative for fever.  HENT:  Negative for hearing loss.   Eyes: Negative.   Respiratory: Negative.    Cardiovascular: Negative.   Gastrointestinal: Negative.   Genitourinary: Negative.   Musculoskeletal: Negative.   Skin:  Negative for rash.  Neurological:  Positive for dizziness, focal weakness and weakness. Negative for headaches.  Psychiatric/Behavioral: Negative.  Negative for depression.      Past Medical History:  Diagnosis Date   Anxiety    Essential hypertension    GERD (gastroesophageal reflux disease)    Hyperlipidemia    Palpitations    Reportedly symptomatic PVCs    Spinal stenosis    Vaginal atrophy 04/18/2015   Vaginal itching 04/18/2015    Past Surgical History:  Procedure Laterality Date    Bilateral breast cyst  1964   CATARACT EXTRACTION W/PHACO Right 03/02/2016   Procedure: CATARACT EXTRACTION PHACO AND INTRAOCULAR LENS PLACEMENT (IOC);  Surgeon: Cherene Mania, MD;  Location: [facility];  Service: Ophthalmology;  Laterality: Right;  CDE 10.57   Caturact Surgery  2010   CHOLECYSTECTOMY     04/07/2011   COLONOSCOPY     11/04/2010 Dr.Rourk   DILATION AND CURETTAGE OF UTERUS  1972   UPPER GASTROINTESTINAL ENDOSCOPY      Family History  Problem Relation Age of Onset   Healthy Son    Heart disease Father    Dementia Mother    Alzheimer's disease Mother    Tuberculosis Maternal Grandmother    Diabetes Maternal Grandmother    Alzheimer's disease Maternal Grandfather    Hypertension Paternal Grandfather    Stroke Paternal Grandfather     Social History:  reports that she has never smoked. She has never used smokeless tobacco. She reports current alcohol  use. She reports that she does not use drugs.   Allergies: Allergies[1]   Medications Prior to Admission  Medication Sig Dispense Refill   acetaminophen  (TYLENOL ) 500 MG tablet Take 1,000 mg by mouth as needed for mild pain or fever.      aspirin  81 MG tablet Take 81 mg by mouth daily.       chlorthalidone (HYGROTON) 25 MG tablet Take 25 mg by mouth every morning.     docusate sodium  (COLACE) 100 MG capsule Take 100 mg by mouth daily as needed for mild constipation.  LORazepam  (ATIVAN ) 1 MG tablet Take 1 mg by mouth at bedtime.     metoprolol  succinate (TOPROL -XL) 50 MG 24 hr tablet Take 50 mg by mouth daily. Take with or immediately following a meal.     pantoprazole  (PROTONIX ) 40 MG tablet Take 40 mg by mouth at bedtime as needed.     Polyethyl Glycol-Propyl Glycol (SYSTANE ULTRA OP) Place 1 drop into both eyes in the morning and at bedtime.     spironolactone  (ALDACTONE ) 25 MG tablet Take 25 mg by mouth daily.     traMADol (ULTRAM) 50 MG tablet Take 50 mg by mouth daily as needed for severe pain.     verapamil  (CALAN-SR) 240 MG CR tablet Take 240 mg by mouth at bedtime.      Home: Home Living Family/patient expects to be discharged to:: Private residence Living Arrangements: Alone Available Help at Discharge: Family, Available PRN/intermittently Type of Home: House Home Access: Stairs to enter [employer] of Steps: 3 Entrance Stairs-Rails: Left Home Layout: One level Bathroom Shower/Tub: Tub/shower unit, Walk-in shower (pt takes baths) Firefighter: Standard Home Equipment: Grab bars - tub/shower, Shower seat   Functional History: Prior Function Prior Level of Function : Independent/Modified Independent, Driving Mobility Comments: Ind, no AD ADLs Comments: Ind  Functional Status:  Mobility: Bed Mobility Overal bed mobility: Needs Assistance Bed Mobility: Supine to Sit Supine to sit: Supervision Sit to supine: Supervision General bed mobility comments: incr time and effort to exit to her rt; near need for assist to scoot out to get feet to floor Transfers Overall transfer level: Needs assistance Equipment used: Rolling walker (2 wheels) Transfers: Sit to/from Stand Sit to Stand: Contact guard assist, Min assist Bed to/from chair/wheelchair/BSC transfer type:: Step pivot Step pivot transfers: Min assist General transfer comment: cues for hand placement. Ambulation/Gait Ambulation/Gait assistance: Contact guard assist, +2 safety/equipment (Chair follow) Gait Distance (Feet): 50 Feet Assistive device: Rolling walker (2 wheels) Gait Pattern/deviations: Decreased stride length, Step-through pattern General Gait Details: Much better R foot clearance today compared to previous session. Chair follow provided for safety. Gait velocity interpretation: <1.8 ft/sec, indicate of risk for recurrent falls    ADL: ADL Overall ADL's : Needs assistance/impaired Eating/Feeding: Set up, Sitting Grooming: Contact guard assist, Standing, Oral care, Brushing hair Grooming Details  (indicate cue type and reason): increased time to open toothpaste but able to manage without assist. CGA for safety standing d/t dizziness. some difficulty accurately spitting into sink Upper Body Bathing: Set up, Sitting Lower Body Bathing: Minimal assistance, Sit to/from stand Upper Body Dressing : Set up, Sitting Lower Body Dressing: Minimal assistance, Sit to/from stand Toilet Transfer: Contact guard assist, BSC/3in1 Toilet Transfer Details (indicate cue type and reason): short step pivot Toileting- Clothing Manipulation and Hygiene: Contact guard assist, Sit to/from stand Functional mobility during ADLs: Contact guard assist, Minimal assistance, +2 for safety/equipment, Rolling walker (2 wheels) General ADL Comments: BP readings variable throughout , as well as pt's reported dizziness. Pt able to progress hallway mobility using RW with CGA-Min A x close chair follow d/t dizziness. Cues for RW navigation around obstacles  Cognition: Cognition Overall Cognitive Status: Within Functional Limits for tasks assessed Orientation Level: Oriented X4 Cognition Arousal: Alert Behavior During Therapy: WFL for tasks assessed/performed, Anxious Overall Cognitive Status: Within Functional Limits for tasks assessed  Physical Exam: Blood pressure (!) 165/80, pulse 92, temperature 98.4 F (36.9 C), temperature source Oral, resp. rate 16, height 5' (1.524 m), weight 63 kg, SpO2 99%. Physical Exam  Constitutional:      General: She is not in acute distress.    Appearance: Normal appearance.  HENT:     Head: Normocephalic.     Right Ear: External ear normal.     Left Ear: External ear normal.     Nose: Nose normal.     Mouth/Throat:     Mouth: Mucous membranes are moist.     Pharynx: No oropharyngeal exudate.  Eyes:     Extraocular Movements: Extraocular movements intact.     Conjunctiva/sclera: Conjunctivae normal.     Pupils: Pupils are equal, round, and reactive to light.  Cardiovascular:      Rate and Rhythm: Normal rate and regular rhythm.     Heart sounds: No murmur heard.    No gallop.  Pulmonary:     Effort: Pulmonary effort is normal. No respiratory distress.     Breath sounds: Normal breath sounds. No wheezing.  Abdominal:     General: Bowel sounds are normal. There is no distension.     Palpations: Abdomen is soft.     Tenderness: There is no abdominal tenderness.  Musculoskeletal:        General: No swelling or tenderness.     Cervical back: Normal range of motion.  Skin:    General: Skin is warm and dry.  Neurological:     Mental Status: She is alert.     Comments: Alert and oriented x 3, missed date only by 3 days. Normal insight and awareness. Intact Memory. Normal language. Speech sl dysarthric. Cranial nerve exam unremarkable except for mild right central VII. MMT: LUE 5/5. RUE 4+/5 delt, bicep, tricep and 4/5 wrist and grip. No PD. LLE 4+/5 prox to 5/5 distally. RLE 4/5 HF, KE and 4+/5 ADF/PF with some motor planning/sequencing issues at the foot. Sensory exam normal for light touch and pain in all 4 limbs. No limb ataxia or cerebellar signs. No abnormal tone appreciated.    Psychiatric:        Mood and Affect: Mood normal.        Behavior: Behavior normal.     Results for orders placed or performed during the hospital encounter of 12/26/24 (from the past 48 hours)  Basic metabolic panel     Status: Abnormal   Collection Time: 12/28/24  9:29 AM  Result Value Ref Range   Sodium 131 (L) 135 - 145 mmol/L   Potassium 4.2 3.5 - 5.1 mmol/L   Chloride 99 98 - 111 mmol/L   CO2 20 (L) 22 - 32 mmol/L   Glucose, Bld 159 (H) 70 - 99 mg/dL    Comment: Glucose reference range applies only to samples taken after fasting for at least 8 hours.   BUN 16 8 - 23 mg/dL   Creatinine, Ser 8.86 (H) 0.44 - 1.00 mg/dL   Calcium  9.3 8.9 - 10.3 mg/dL   GFR, Estimated 46 (L) >60 mL/min    Comment: (NOTE) Calculated using the CKD-EPI Creatinine Equation (2021)    Anion gap 12 5  - 15    Comment: Performed at [facility], [address], [place], [place] [address]   ECHOCARDIOGRAM COMPLETE Result Date: 12/27/2024    ECHOCARDIOGRAM REPORT   Patient Name:   DELANA MANGANELLO Date of Exam: 12/27/2024 Medical Rec #:  [id number]        Height:       60.0 in Accession #:    [id number]       Weight:  138.9 lb Date of Birth:  [birth year]         BSA:          1.599 m Patient Age:    89 years         BP:           166/82 mmHg Patient Gender: F                HR:           78 bpm. Exam Location:  Inpatient Procedure: 2D Echo, Cardiac Doppler and Color Doppler (Both Spectral and Color            Flow Doppler were utilized during procedure). Indications:    Stroke  History:        Patient has no prior history of Echocardiogram examinations,                 most recent 03/22/2020. Stroke, Arrythmias:PVC; Risk                 Factors:Hypertension and Dyslipidemia.  Sonographer:    Juliene Rucks Referring Phys: [phone] CARLOS MADERA IMPRESSIONS  1. Left ventricular ejection fraction, by estimation, is 55 to 60%. The left ventricle has normal function. The left ventricle has no regional wall motion abnormalities. Left ventricular diastolic parameters were normal.  2. Right ventricular systolic function is normal. The right ventricular size is normal. Tricuspid regurgitation signal is inadequate for assessing PA pressure.  3. The mitral valve is normal in structure. Mild to moderate mitral valve regurgitation. No evidence of mitral stenosis.  4. The aortic valve is normal in structure. Aortic valve regurgitation is not visualized. No aortic stenosis is present.  5. The inferior vena cava is normal in size with greater than 50% respiratory variability, suggesting right atrial pressure of 3 mmHg. FINDINGS  Left Ventricle: Left ventricular ejection fraction, by estimation, is 55 to 60%. The left ventricle has normal function. The left ventricle has no regional wall motion abnormalities. The left ventricular  internal cavity size was normal in size. There is  no left ventricular hypertrophy. Left ventricular diastolic parameters were normal. Right Ventricle: The right ventricular size is normal. No increase in right ventricular wall thickness. Right ventricular systolic function is normal. Tricuspid regurgitation signal is inadequate for assessing PA pressure. Left Atrium: Left atrial size was normal in size. Right Atrium: Right atrial size was normal in size. Pericardium: There is no evidence of pericardial effusion. Presence of epicardial fat layer. Mitral Valve: The mitral valve is normal in structure. Mild to moderate mitral valve regurgitation. No evidence of mitral valve stenosis. Tricuspid Valve: The tricuspid valve is normal in structure. Tricuspid valve regurgitation is not demonstrated. No evidence of tricuspid stenosis. Aortic Valve: The aortic valve is normal in structure. Aortic valve regurgitation is not visualized. No aortic stenosis is present. Aortic valve mean gradient measures 3.0 mmHg. Aortic valve peak gradient measures 6.1 mmHg. Aortic valve area, by VTI measures 1.95 cm. Pulmonic Valve: The pulmonic valve was normal in structure. Pulmonic valve regurgitation is not visualized. No evidence of pulmonic stenosis. Aorta: The aortic root and ascending aorta are structurally normal, with no evidence of dilitation. Venous: The inferior vena cava is normal in size with greater than 50% respiratory variability, suggesting right atrial pressure of 3 mmHg. IAS/Shunts: No atrial level shunt detected by color flow Doppler.  LEFT VENTRICLE PLAX 2D LVIDd:         4.50 cm     Diastology LVIDs:  3.40 cm     LV e' medial:    7.33 cm/s LV PW:         0.90 cm     LV E/e' medial:  10.2 LV IVS:        1.00 cm     LV e' lateral:   12.10 cm/s LVOT diam:     1.90 cm     LV E/e' lateral: 6.2 LV SV:         55 LV SV Index:   35 LVOT Area:     2.84 cm  LV Volumes (MOD) LV vol d, MOD A2C: 68.4 ml LV vol d, MOD A4C:  68.7 ml LV vol s, MOD A2C: 32.7 ml LV vol s, MOD A4C: 26.1 ml LV SV MOD A2C:     35.7 ml LV SV MOD A4C:     68.7 ml LV SV MOD BP:      39.8 ml RIGHT VENTRICLE RV Basal diam:  3.10 cm RV Mid diam:    2.50 cm RV S prime:     15.70 cm/s TAPSE (M-mode): 2.1 cm LEFT ATRIUM             Index        RIGHT ATRIUM           Index LA diam:        2.50 cm 1.56 cm/m   RA Area:     12.70 cm LA Vol (A2C):   51.7 ml 32.34 ml/m  RA Volume:   30.50 ml  19.08 ml/m LA Vol (A4C):   41.8 ml 26.15 ml/m LA Biplane Vol: 46.8 ml 29.27 ml/m  AORTIC VALVE AV Area (Vmax):    2.02 cm AV Area (Vmean):   2.06 cm AV Area (VTI):     1.95 cm AV Vmax:           123.00 cm/s AV Vmean:          83.400 cm/s AV VTI:            0.284 m AV Peak Grad:      6.1 mmHg AV Mean Grad:      3.0 mmHg LVOT Vmax:         87.50 cm/s LVOT Vmean:        60.600 cm/s LVOT VTI:          0.195 m LVOT/AV VTI ratio: 0.69  AORTA Ao Root diam: 2.60 cm Ao Asc diam:  3.00 cm MITRAL VALVE MV Area (PHT): 4.60 cm     SHUNTS MV Decel Time: 165 msec     Systemic VTI:  0.20 m MV E velocity: 74.70 cm/s   Systemic Diam: 1.90 cm MV A velocity: 135.00 cm/s MV E/A ratio:  0.55 Kardie Tobb DO Electronically signed by Dub Huntsman DO Signature Date/Time: 12/27/2024/4:42:09 PM    Final       Blood pressure (!) 165/80, pulse 92, temperature 98.4 F (36.9 C), temperature source Oral, resp. rate 16, height 5' (1.524 m), weight 63 kg, SpO2 99%.  Medical Problem List and Plan: 1. Functional deficits secondary to left thalamic and left hippocampus due to severe right PCA stenosis  -patient may  shower  -ELOS/Goals: 5-7 days, supervision to mod I goals with PT, OT, SLP 2.  Antithrombotics: -DVT/anticoagulation:  Pharmaceutical: Lovenox   -antiplatelet therapy: DAPT X 3 months followed by Plavix  alone.  3. Pain Management: Tylenol  prn.  4. Mood/Behavior/Sleep: LCSW to follow for evaluation and support.   -antipsychotic agents:  N/A  --ativan  scheduled for sleep (was on 1 mg  PTA).  5. Neuropsych/cognition: This patient is capable of making decisions on her own behalf. 6. Skin/Wound Care: Routine pressure relief measures  7. Fluids/Electrolytes/Nutrition: Monitor I/O check CMET in am.  8.  HTN: Monitor BP TID--continue metoprolol  25 mg daily --Avoid Low BP. Continue to hold hygroton and spironolactone  9.  AKI: Improving with SCr down 1.55-->1.13 10. Chronic hyponatremia: Na 130 at admission. ( Has been as low as 125 in the past)  11.  Hyperlipidemia:LDL 116 with goal <70. On Lipitor 40 mg daily 12. Anemia: Likely dilutional due to improvement in hydration   --recheck check CBC in am.  13. GERD: On Protonix      Sharlet GORMAN Schmitz, PA-C 12/29/2024     [1]  Allergies Allergen Reactions   Tricyclic Antidepressants Other (See Comments)    felt like I was going to have a seizure   "

## 2024-12-29 NOTE — H&P (Signed)
 Physical Medicine and Rehabilitation Admission H&P     CC: Functional decline due to stroke     HPI: [name] is a 89 year old female with history of HTN, chronic LBP due to lumbar spondylosis with myelopathy, anxiety, h/o PVCs who was admitted on 12/26/24 with right sided weakness with numbness, fall and word finding difficulty she also reported poor intake with diarrhea couple of days PTA. CT head negative. She was noted to be dehydrated with BUN 30, SCr 1.55, K-5.4 and CO2- 19. MRI/MRA brain done revealing left thalamic and left temporal white matter infarct with abnormal FLAIR left hippocampus, multifocal moderate to severe stenosis of right P2 segment and severe stenosis mi/distal P2 segment of L-PCA. EEG done negative for seizures. Dr. Jerri felt that stroke was likely due to R-PCA severe stenosis in setting of AKI, dehydration and diarrhea.  Plavix  added to ASA with recommendations to continue for 3 months followed by Plavix  alone.            PT/OT has been working with patient who requires CGA for ADL tasks and CGA to min assist with mobility. She lives alone and was independent PTA. CIR recommended due to functional decline.      Review of Systems  Constitutional:  Negative for fever.  HENT:  Negative for hearing loss.   Eyes: Negative.   Respiratory: Negative.    Cardiovascular: Negative.   Gastrointestinal: Negative.   Genitourinary: Negative.   Musculoskeletal: Negative.   Skin:  Negative for rash.  Neurological:  Positive for dizziness, focal weakness and weakness. Negative for headaches.  Psychiatric/Behavioral: Negative.  Negative for depression.            Past Medical History:  Diagnosis Date   Anxiety     Essential hypertension     GERD (gastroesophageal reflux disease)     Hyperlipidemia     Palpitations      Reportedly symptomatic PVCs    Spinal stenosis     Vaginal atrophy 04/18/2015   Vaginal itching 04/18/2015               Past Surgical History:   Procedure Laterality Date   Bilateral breast cyst   1964   CATARACT EXTRACTION W/PHACO Right 03/02/2016    Procedure: CATARACT EXTRACTION PHACO AND INTRAOCULAR LENS PLACEMENT (IOC);  Surgeon: Cherene Mania, MD;  Location: [facility];  Service: Ophthalmology;  Laterality: Right;  CDE 10.57   Caturact Surgery   2010   CHOLECYSTECTOMY        04/07/2011   COLONOSCOPY        11/04/2010 Dr.Rourk   DILATION AND CURETTAGE OF UTERUS   1972   UPPER GASTROINTESTINAL ENDOSCOPY                   Family History  Problem Relation Age of Onset   Healthy Son     Heart disease Father     Dementia Mother     Alzheimer's disease Mother     Tuberculosis Maternal Grandmother     Diabetes Maternal Grandmother     Alzheimer's disease Maternal Grandfather     Hypertension Paternal Grandfather     Stroke Paternal Grandfather            Social History:  reports that she has never smoked. She has never used smokeless tobacco. She reports current alcohol  use. She reports that she does not use drugs.     Allergies: [Allergies]  [Allergies]      Allergen  Reactions   Tricyclic Antidepressants Other (See Comments)      felt like I was going to have a seizure             Medications Prior to Admission  Medication Sig Dispense Refill   acetaminophen  (TYLENOL ) 500 MG tablet Take 1,000 mg by mouth as needed for mild pain or fever.        aspirin  81 MG tablet Take 81 mg by mouth daily.         chlorthalidone (HYGROTON) 25 MG tablet Take 25 mg by mouth every morning.       docusate sodium  (COLACE) 100 MG capsule Take 100 mg by mouth daily as needed for mild constipation.       LORazepam  (ATIVAN ) 1 MG tablet Take 1 mg by mouth at bedtime.       metoprolol  succinate (TOPROL -XL) 50 MG 24 hr tablet Take 50 mg by mouth daily. Take with or immediately following a meal.       pantoprazole  (PROTONIX ) 40 MG tablet Take 40 mg by mouth at bedtime as needed.       Polyethyl Glycol-Propyl Glycol (SYSTANE ULTRA OP)  Place 1 drop into both eyes in the morning and at bedtime.       spironolactone  (ALDACTONE ) 25 MG tablet Take 25 mg by mouth daily.       traMADol (ULTRAM) 50 MG tablet Take 50 mg by mouth daily as needed for severe pain.       verapamil (CALAN-SR) 240 MG CR tablet Take 240 mg by mouth at bedtime.              Home: Home Living Family/patient expects to be discharged to:: Private residence Living Arrangements: Alone Available Help at Discharge: Family, Available PRN/intermittently Type of Home: House Home Access: Stairs to enter [employer] of Steps: 3 Entrance Stairs-Rails: Left Home Layout: One level Bathroom Shower/Tub: Tub/shower unit, Walk-in shower (pt takes baths) Firefighter: Standard Home Equipment: Grab bars - tub/shower, Shower seat   Functional History: Prior Function Prior Level of Function : Independent/Modified Independent, Driving Mobility Comments: Ind, no AD ADLs Comments: Ind   Functional Status:  Mobility: Bed Mobility Overal bed mobility: Needs Assistance Bed Mobility: Supine to Sit Supine to sit: Supervision Sit to supine: Supervision General bed mobility comments: incr time and effort to exit to her rt; near need for assist to scoot out to get feet to floor Transfers Overall transfer level: Needs assistance Equipment used: Rolling walker (2 wheels) Transfers: Sit to/from Stand Sit to Stand: Contact guard assist, Min assist Bed to/from chair/wheelchair/BSC transfer type:: Step pivot Step pivot transfers: Min assist General transfer comment: cues for hand placement. Ambulation/Gait Ambulation/Gait assistance: Contact guard assist, +2 safety/equipment (Chair follow) Gait Distance (Feet): 50 Feet Assistive device: Rolling walker (2 wheels) Gait Pattern/deviations: Decreased stride length, Step-through pattern General Gait Details: Much better R foot clearance today compared to previous session. Chair follow provided for safety. Gait  velocity interpretation: <1.8 ft/sec, indicate of risk for recurrent falls   ADL: ADL Overall ADL's : Needs assistance/impaired Eating/Feeding: Set up, Sitting Grooming: Contact guard assist, Standing, Oral care, Brushing hair Grooming Details (indicate cue type and reason): increased time to open toothpaste but able to manage without assist. CGA for safety standing d/t dizziness. some difficulty accurately spitting into sink Upper Body Bathing: Set up, Sitting Lower Body Bathing: Minimal assistance, Sit to/from stand Upper Body Dressing : Set up, Sitting Lower Body Dressing: Minimal assistance, Sit to/from stand  Toilet Transfer: Contact guard assist, Agricultural Engineer Details (indicate cue type and reason): short step pivot Toileting- Clothing Manipulation and Hygiene: Contact guard assist, Sit to/from stand Functional mobility during ADLs: Contact guard assist, Minimal assistance, +2 for safety/equipment, Rolling walker (2 wheels) General ADL Comments: BP readings variable throughout , as well as pt's reported dizziness. Pt able to progress hallway mobility using RW with CGA-Min A x close chair follow d/t dizziness. Cues for RW navigation around obstacles   Cognition: Cognition Overall Cognitive Status: Within Functional Limits for tasks assessed Orientation Level: Oriented X4 Cognition Arousal: Alert Behavior During Therapy: WFL for tasks assessed/performed, Anxious Overall Cognitive Status: Within Functional Limits for tasks assessed   Physical Exam: Blood pressure (!) 165/80, pulse 92, temperature 98.4 F (36.9 C), temperature source Oral, resp. rate 16, height 5' (1.524 m), weight 63 kg, SpO2 99%. Physical Exam Constitutional:      General: She is not in acute distress.    Appearance: Normal appearance.  HENT:     Head: Normocephalic.     Right Ear: External ear normal.     Left Ear: External ear normal.     Nose: Nose normal.     Mouth/Throat:     Mouth: Mucous  membranes are moist.     Pharynx: No oropharyngeal exudate.  Eyes:     Extraocular Movements: Extraocular movements intact.     Conjunctiva/sclera: Conjunctivae normal.     Pupils: Pupils are equal, round, and reactive to light.  Cardiovascular:     Rate and Rhythm: Normal rate and regular rhythm.     Heart sounds: No murmur heard.    No gallop.  Pulmonary:     Effort: Pulmonary effort is normal. No respiratory distress.     Breath sounds: Normal breath sounds. No wheezing.  Abdominal:     General: Bowel sounds are normal. There is no distension.     Palpations: Abdomen is soft.     Tenderness: There is no abdominal tenderness.  Musculoskeletal:        General: No swelling or tenderness.     Cervical back: Normal range of motion.  Skin:    General: Skin is warm and dry.  Neurological:     Mental Status: She is alert.     Comments: Alert and oriented x 3, missed date only by 3 days. Normal insight and awareness. Intact Memory. Normal language. Speech sl dysarthric. Cranial nerve exam unremarkable except for mild right central VII. MMT: LUE 5/5. RUE 4+/5 delt, bicep, tricep and 4/5 wrist and grip. No PD. LLE 4+/5 prox to 5/5 distally. RLE 4/5 HF, KE and 4+/5 ADF/PF with some motor planning/sequencing issues at the foot. Sensory exam normal for light touch and pain in all 4 limbs. No limb ataxia or cerebellar signs. No abnormal tone appreciated.    Psychiatric:        Mood and Affect: Mood normal.        Behavior: Behavior normal.       Lab Results Last 48 Hours        Results for orders placed or performed during the hospital encounter of 12/26/24 (from the past 48 hours)  Basic metabolic panel     Status: Abnormal    Collection Time: 12/28/24  9:29 AM  Result Value Ref Range    Sodium 131 (L) 135 - 145 mmol/L    Potassium 4.2 3.5 - 5.1 mmol/L    Chloride 99 98 - 111 mmol/L    CO2  20 (L) 22 - 32 mmol/L    Glucose, Bld 159 (H) 70 - 99 mg/dL      Comment: Glucose reference range  applies only to samples taken after fasting for at least 8 hours.    BUN 16 8 - 23 mg/dL    Creatinine, Ser 8.86 (H) 0.44 - 1.00 mg/dL    Calcium  9.3 8.9 - 10.3 mg/dL    GFR, Estimated 46 (L) >60 mL/min      Comment: (NOTE) Calculated using the CKD-EPI Creatinine Equation (2021)      Anion gap 12 5 - 15      Comment: Performed at [facility], [address], [place], [place] [address]      Imaging Results (Last 48 hours)  ECHOCARDIOGRAM COMPLETE Result Date: 12/27/2024    ECHOCARDIOGRAM REPORT   Patient Name:   [name] Date of Exam: 12/27/2024 Medical Rec #:  [id number]        Height:       60.0 in Accession #:    [id number]       Weight:       138.9 lb Date of Birth:  07-09-1931         BSA:          1.599 m Patient Age:    89 years         BP:           166/82 mmHg Patient Gender: F                HR:           78 bpm. Exam Location:  Inpatient Procedure: 2D Echo, Cardiac Doppler and Color Doppler (Both Spectral and Color            Flow Doppler were utilized during procedure). Indications:    Stroke  History:        Patient has no prior history of Echocardiogram examinations,                 most recent 03/22/2020. Stroke, Arrythmias:PVC; Risk                 Factors:Hypertension and Dyslipidemia.  Sonographer:    Juliene Rucks Referring Phys: [phone] CARLOS MADERA IMPRESSIONS  1. Left ventricular ejection fraction, by estimation, is 55 to 60%. The left ventricle has normal function. The left ventricle has no regional wall motion abnormalities. Left ventricular diastolic parameters were normal.  2. Right ventricular systolic function is normal. The right ventricular size is normal. Tricuspid regurgitation signal is inadequate for assessing PA pressure.  3. The mitral valve is normal in structure. Mild to moderate mitral valve regurgitation. No evidence of mitral stenosis.  4. The aortic valve is normal in structure. Aortic valve regurgitation is not visualized. No aortic stenosis is present.   5. The inferior vena cava is normal in size with greater than 50% respiratory variability, suggesting right atrial pressure of 3 mmHg. FINDINGS  Left Ventricle: Left ventricular ejection fraction, by estimation, is 55 to 60%. The left ventricle has normal function. The left ventricle has no regional wall motion abnormalities. The left ventricular internal cavity size was normal in size. There is  no left ventricular hypertrophy. Left ventricular diastolic parameters were normal. Right Ventricle: The right ventricular size is normal. No increase in right ventricular wall thickness. Right ventricular systolic function is normal. Tricuspid regurgitation signal is inadequate for assessing PA pressure. Left Atrium: Left atrial size was normal in  size. Right Atrium: Right atrial size was normal in size. Pericardium: There is no evidence of pericardial effusion. Presence of epicardial fat layer. Mitral Valve: The mitral valve is normal in structure. Mild to moderate mitral valve regurgitation. No evidence of mitral valve stenosis. Tricuspid Valve: The tricuspid valve is normal in structure. Tricuspid valve regurgitation is not demonstrated. No evidence of tricuspid stenosis. Aortic Valve: The aortic valve is normal in structure. Aortic valve regurgitation is not visualized. No aortic stenosis is present. Aortic valve mean gradient measures 3.0 mmHg. Aortic valve peak gradient measures 6.1 mmHg. Aortic valve area, by VTI measures 1.95 cm. Pulmonic Valve: The pulmonic valve was normal in structure. Pulmonic valve regurgitation is not visualized. No evidence of pulmonic stenosis. Aorta: The aortic root and ascending aorta are structurally normal, with no evidence of dilitation. Venous: The inferior vena cava is normal in size with greater than 50% respiratory variability, suggesting right atrial pressure of 3 mmHg. IAS/Shunts: No atrial level shunt detected by color flow Doppler.  LEFT VENTRICLE PLAX 2D LVIDd:         4.50  cm     Diastology LVIDs:         3.40 cm     LV e' medial:    7.33 cm/s LV PW:         0.90 cm     LV E/e' medial:  10.2 LV IVS:        1.00 cm     LV e' lateral:   12.10 cm/s LVOT diam:     1.90 cm     LV E/e' lateral: 6.2 LV SV:         55 LV SV Index:   35 LVOT Area:     2.84 cm  LV Volumes (MOD) LV vol d, MOD A2C: 68.4 ml LV vol d, MOD A4C: 68.7 ml LV vol s, MOD A2C: 32.7 ml LV vol s, MOD A4C: 26.1 ml LV SV MOD A2C:     35.7 ml LV SV MOD A4C:     68.7 ml LV SV MOD BP:      39.8 ml RIGHT VENTRICLE RV Basal diam:  3.10 cm RV Mid diam:    2.50 cm RV S prime:     15.70 cm/s TAPSE (M-mode): 2.1 cm LEFT ATRIUM             Index        RIGHT ATRIUM           Index LA diam:        2.50 cm 1.56 cm/m   RA Area:     12.70 cm LA Vol (A2C):   51.7 ml 32.34 ml/m  RA Volume:   30.50 ml  19.08 ml/m LA Vol (A4C):   41.8 ml 26.15 ml/m LA Biplane Vol: 46.8 ml 29.27 ml/m  AORTIC VALVE AV Area (Vmax):    2.02 cm AV Area (Vmean):   2.06 cm AV Area (VTI):     1.95 cm AV Vmax:           123.00 cm/s AV Vmean:          83.400 cm/s AV VTI:            0.284 m AV Peak Grad:      6.1 mmHg AV Mean Grad:      3.0 mmHg LVOT Vmax:         87.50 cm/s LVOT Vmean:        60.600 cm/s LVOT  VTI:          0.195 m LVOT/AV VTI ratio: 0.69  AORTA Ao Root diam: 2.60 cm Ao Asc diam:  3.00 cm MITRAL VALVE MV Area (PHT): 4.60 cm     SHUNTS MV Decel Time: 165 msec     Systemic VTI:  0.20 m MV E velocity: 74.70 cm/s   Systemic Diam: 1.90 cm MV A velocity: 135.00 cm/s MV E/A ratio:  0.55 Kardie Tobb DO Electronically signed by Dub Huntsman DO Signature Date/Time: 12/27/2024/4:42:09 PM    Final             Blood pressure (!) 165/80, pulse 92, temperature 98.4 F (36.9 C), temperature source Oral, resp. rate 16, height 5' (1.524 m), weight 63 kg, SpO2 99%.    Medical Problem List and Plan: 1. Functional deficits secondary to left thalamic and left hippocampus due to severe right PCA stenosis             -patient may  shower              -ELOS/Goals: 5-7 days, supervision to mod I goals with PT, OT, SLP 2.  Antithrombotics: -DVT/anticoagulation:  Pharmaceutical: Lovenox              -antiplatelet therapy: DAPT X 3 months followed by Plavix  alone.  3. Pain Management: Tylenol  prn.  4. Mood/Behavior/Sleep: LCSW to follow for evaluation and support.              -antipsychotic agents:  N/A             --ativan  scheduled for sleep (was on 1 mg PTA).  5. Neuropsych/cognition: This patient is capable of making decisions on her own behalf. 6. Skin/Wound Care: Routine pressure relief measures  7. Fluids/Electrolytes/Nutrition: Monitor I/O check CMET in am.  8.  HTN: Monitor BP TID--continue metoprolol  25 mg daily --Avoid Low BP. Continue to hold hygroton and spironolactone  9.  AKI: Improving with SCr down 1.55-->1.13 10. Chronic hyponatremia: Na 130 at admission. ( Has been as low as 125 in the past)  11.  Hyperlipidemia:LDL 116 with goal <70. On Lipitor 40 mg daily 12. Anemia: Likely dilutional due to improvement in hydration              --recheck check CBC in am.  13. GERD: On Protonix        Nicole GORMAN Schmitz, PA-C 12/29/2024  I have personally performed a face to face diagnostic evaluation of this patient and formulated the key components of the plan.  Additionally, I have personally reviewed laboratory data, imaging studies, as well as relevant notes and concur with the physician assistant's documentation above.  The patient's status has not changed from the original H&P.  Any changes in documentation from the acute care chart have been noted above.  Arthea IVAR Gunther, MD, LEELLEN

## 2024-12-29 NOTE — Progress Notes (Signed)
 Physical Therapy Treatment Patient Details Name: [name] MRN: [id number] DOB: 1930/12/20 Today's Date: 12/29/2024   History of Present Illness Pt is a 89 y.o female presenting to [facility] for concerns for CVA. Pt report LE weakness and speech changes. 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 PMH: HTN, HLD, spinal stenosis    PT Comments  Pt tolerated treatment well today. Co-treat with OT. Pt today was able to progress ambulation with RW CGA and a chair follow. Pt still with reported dizziness and BP was noted to drop with initial standing however mostly remained in the same range. BP supine: 178/77, BP seated: 185/85, BP standing: 153/87, BP post walk: 187/79. No change in DC/DME recs at this time. PT will continue to follow.      If plan is discharge home, recommend the following: A little help with walking and/or transfers;A little help with bathing/dressing/bathroom;Assistance with cooking/housework;Assist for transportation;Help with stairs or ramp for entrance   Can travel by private vehicle        Equipment Recommendations  Rolling walker (2 wheels)    Recommendations for Other Services       Precautions / Restrictions Precautions Precautions: Fall Recall of Precautions/Restrictions: Intact Precaution/Restrictions Comments: monitor BP, watch dizziness Restrictions Weight Bearing Restrictions Per Provider Order: No     Mobility  Bed Mobility Overal bed mobility: Needs Assistance Bed Mobility: Supine to Sit     Supine to sit: Supervision          Transfers Overall transfer level: Needs assistance Equipment used: Rolling walker (2 wheels) Transfers: Sit to/from Stand Sit to Stand: Contact guard assist, Min assist           General transfer comment: cues for hand placement.    Ambulation/Gait Ambulation/Gait assistance: Contact guard assist, +2 safety/equipment (Chair follow) Gait Distance (Feet): 50  Feet Assistive device: Rolling walker (2 wheels) Gait Pattern/deviations: Decreased stride length, Step-through pattern   Gait velocity interpretation: <1.8 ft/sec, indicate of risk for recurrent falls   General Gait Details: Much better R foot clearance today compared to previous session. Chair follow provided for safety.   Stairs             Wheelchair Mobility     Tilt Bed    Modified Rankin (Stroke Patients Only) Modified Rankin (Stroke Patients Only) Pre-Morbid Rankin Score: No symptoms Modified Rankin: Moderately severe disability     Balance Overall balance assessment: Needs assistance, History of Falls Sitting-balance support: No upper extremity supported, Feet supported Sitting balance-Leahy Scale: Fair     Standing balance support: Bilateral upper extremity supported, During functional activity Standing balance-Leahy Scale: Poor                              Communication Communication Communication: No apparent difficulties  Cognition Arousal: Alert Behavior During Therapy: WFL for tasks assessed/performed, Anxious                             Following commands: Intact      Cueing Cueing Techniques: Verbal cues, Gestural cues  Exercises      General Comments General comments (skin integrity, edema, etc.): BP supine: 178/77, BP seated: 185/85, BP standing: 153/87, BP post walk: 187/79 Taken all in LUE      Pertinent Vitals/Pain Pain Assessment Pain Assessment: No/denies pain    Home Living  Prior Function            PT Goals (current goals can now be found in the care plan section) Progress towards PT goals: Progressing toward goals    Frequency    Min 3X/week      PT Plan      Co-evaluation              AM-PAC PT 6 Clicks Mobility   Outcome Measure  Help needed turning from your back to your side while in a flat bed without using bedrails?: None Help  needed moving from lying on your back to sitting on the side of a flat bed without using bedrails?: A Little Help needed moving to and from a bed to a chair (including a wheelchair)?: A Little Help needed standing up from a chair using your arms (e.g., wheelchair or bedside chair)?: A Little Help needed to walk in hospital room?: A Lot Help needed climbing 3-5 steps with a railing? : Total 6 Click Score: 16    End of Session Equipment Utilized During Treatment: Gait belt Activity Tolerance: Patient tolerated treatment well Patient left: in chair;with call bell/phone within reach;with chair alarm set Nurse Communication: Mobility status PT Visit Diagnosis: Other abnormalities of gait and mobility (R26.89);Muscle weakness (generalized) (M62.81);History of falling (Z91.81);Dizziness and giddiness (R42)     Time: 9099-9073 PT Time Calculation (min) (ACUTE ONLY): 26 min  Charges:    $Gait Training: 8-22 mins PT General Charges $$ ACUTE PT VISIT: 1 Visit                     Sueellen NOVAK, PT, DPT Acute Rehab Services [phone]    Siboney Requejo 12/29/2024, 2:06 PM

## 2024-12-29 NOTE — Progress Notes (Addendum)
 Patient with continuous bleeding from SW heparin  site this am. On DAPT and heparin  Will d/c heparin  and add SCDs

## 2024-12-29 NOTE — Progress Notes (Addendum)
 SCDs applied at this time. Lovenox  discontinued by provided, education provided to patient, no needs at this time, will continue to monitor bleeding site.

## 2024-12-29 NOTE — Progress Notes (Signed)
 Admitting patient at 1630, patient has no complaints of pain and is Aox4, room air, and awake. Phone/call bell, food drink, table all within arms reach, bed at lowest position  12/29/24 1731  Vitals  Temp 98.4 F (36.9 C)  Temp Source Oral  BP (!) 164/88  MAP (mmHg) 112  BP Location Left Arm  BP Method Automatic  Patient Position (if appropriate) Lying  Pulse Rate 98  Pulse Rate Source Dinamap  Resp 20  Level of Consciousness  Level of Consciousness Alert  Oxygen Therapy  SpO2 98 %  O2 Device Room Air  Pain Assessment  Pain Scale 0-10  Pain Score 0  Height and Weight  Height 5' (1.524 m)  Weight 63 kg  BSA (Calculated - sq m) 1.63 sq meters  BMI (Calculated) 27.13  Weight in (lb) to have BMI = 25 127.7

## 2024-12-29 NOTE — Progress Notes (Signed)
 Occupational Therapy Treatment Patient Details Name: [name] MRN: [id number] DOB: [birth year] Today's Date: 12/29/2024   History of present illness Pt is a 89 y.o female presenting to [facility] for concerns for CVA. Pt report LE weakness and speech changes. 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 PMH: HTN, HLD, spinal stenosis   OT comments  Pt remains eager to participate with therapies though limited by ongoing dizziness with activity. Pt with drop in BP with initial standing but otherwise BP remained in same range (see general comments for BP readings). However, pt reports dizziness throughout session. Pt requires CGA-Min A to stand and CGA-Min A for hallway mobility using RW w/ close chair follow. Pt reporting ongoing coordination deficits with RUE - provided theraputty HEP and fine motor coordination activities handout to address these deficits. Continue to feel pt will make excellent progress to previously independent PLOF with intensive rehab services > 3 hours therapy per day.      If plan is discharge home, recommend the following:  A little help with walking and/or transfers;A little help with bathing/dressing/bathroom;Assistance with cooking/housework   Equipment Recommendations  Other (comment) (TBD)    Recommendations for Other Services Rehab consult    Precautions / Restrictions Precautions Precautions: Fall Recall of Precautions/Restrictions: Intact Precaution/Restrictions Comments: monitor BP, watch dizziness Restrictions Weight Bearing Restrictions Per Provider Order: No       Mobility Bed Mobility Overal bed mobility: Needs Assistance Bed Mobility: Supine to Sit     Supine to sit: Supervision          Transfers Overall transfer level: Needs assistance Equipment used: Rolling walker (2 wheels) Transfers: Sit to/from Stand Sit to Stand: Contact guard assist, Min assist           General transfer  comment: cues for hand placement. CGA from bedside with RW, light Min A to stand from recliner at sink without RW     Balance Overall balance assessment: Needs assistance, History of Falls Sitting-balance support: No upper extremity supported, Feet supported Sitting balance-Leahy Scale: Fair     Standing balance support: Bilateral upper extremity supported, During functional activity Standing balance-Leahy Scale: Poor                             ADL either performed or assessed with clinical judgement   ADL Overall ADL's : Needs assistance/impaired     Grooming: Contact guard assist;Standing;Oral care;Brushing hair Grooming Details (indicate cue type and reason): increased time to open toothpaste but able to manage without assist. CGA for safety standing d/t dizziness. some difficulty accurately spitting into sink         Upper Body Dressing : Set up;Sitting                   Functional mobility during ADLs: Contact guard assist;Minimal assistance;+2 for safety/equipment;Rolling walker (2 wheels) General ADL Comments: BP readings variable throughout , as well as pt's reported dizziness. Pt able to progress hallway mobility using RW with CGA-Min A x close chair follow d/t dizziness. Cues for RW navigation around obstacles    Extremity/Trunk Assessment Upper Extremity Assessment Upper Extremity Assessment: Right hand dominant;RUE deficits/detail RUE Deficits / Details: AROM, strength, sensation WFL. Decreased motor planning and coordination. provided theraputty HEP and fine motor coordination exercises to address hand function   Lower Extremity Assessment Lower Extremity Assessment: Defer to PT evaluation  Vision   Vision Assessment?: Vision impaired- to be further tested in functional context   Perception     Praxis     Communication Communication Communication: No apparent difficulties   Cognition Arousal: Alert Behavior During Therapy: WFL  for tasks assessed/performed, Anxious Cognition: Cognition impaired         Attention impairment (select first level of impairment): Selective attention Executive functioning impairment (select all impairments): Problem solving OT - Cognition Comments: pleasant, cognition appears to be improving and pt asking appropriate questions                 Following commands: Intact        Cueing   Cueing Techniques: Verbal cues, Gestural cues  Exercises      Shoulder Instructions       General Comments BP supine: 178/77, BP seated: 185/85, BP standing: 153/87, BP post walk: 187/79 Taken all in LUE    Pertinent Vitals/ Pain       Pain Assessment Pain Assessment: No/denies pain  Home Living                                          Prior Functioning/Environment              Frequency  Min 2X/week        Progress Toward Goals  OT Goals(current goals can now be found in the care plan section)  Progress towards OT goals: Progressing toward goals  Acute Rehab OT Goals Patient Stated Goal: get back to where I was OT Goal Formulation: With patient Time For Goal Achievement: 01/10/25 Potential to Achieve Goals: Good ADL Goals Pt Will Perform Grooming: with modified independence;standing Pt Will Perform Upper Body Dressing: with modified independence;sitting Pt Will Perform Lower Body Dressing: with modified independence;sit to/from stand;sitting/lateral leans Pt Will Transfer to Toilet: with modified independence;ambulating;regular height toilet Pt Will Perform Toileting - Clothing Manipulation and hygiene: with modified independence;sit to/from stand Additional ADL Goal #1: Pt will pillbox test with 5/5 accuracy to increase ind with IADLs for med management  Plan      Co-evaluation                 AM-PAC OT 6 Clicks Daily Activity     Outcome Measure   Help from another person eating meals?: None Help from another person taking  care of personal grooming?: A Little Help from another person toileting, which includes using toliet, bedpan, or urinal?: A Little Help from another person bathing (including washing, rinsing, drying)?: A Little Help from another person to put on and taking off regular upper body clothing?: A Little Help from another person to put on and taking off regular lower body clothing?: A Little 6 Click Score: 19    End of Session Equipment Utilized During Treatment: Gait belt;Rolling walker (2 wheels)  OT Visit Diagnosis: Unsteadiness on feet (R26.81);Other abnormalities of gait and mobility (R26.89);Muscle weakness (generalized) (M62.81);Apraxia (R48.2)   Activity Tolerance Patient tolerated treatment well;Other (comment) (limited by dizziness.)   Patient Left in chair;with call bell/phone within reach;with chair alarm set   Nurse Communication          Time: [phone] OT Time Calculation (min): 31 min  Charges: OT General Charges $OT Visit: 1 Visit OT Treatments $Self Care/Home Management : 8-22 mins  Mliss NOVAK, OTR/L Acute Rehab Services Office: [phone]   Mliss Fish  12/29/2024, 11:18 AM
